# Patient Record
Sex: Female | Born: 1945 | Race: White | Hispanic: No | Marital: Married | Smoking: Never smoker
Health system: Southern US, Community
[De-identification: ages and names within clinical notes are randomized; demographics above are authoritative.]

## PROBLEM LIST (undated history)

## (undated) DIAGNOSIS — M1990 Unspecified osteoarthritis, unspecified site: Secondary | ICD-10-CM

## (undated) DIAGNOSIS — F32A Depression, unspecified: Secondary | ICD-10-CM

## (undated) DIAGNOSIS — E063 Autoimmune thyroiditis: Secondary | ICD-10-CM

## (undated) DIAGNOSIS — E039 Hypothyroidism, unspecified: Secondary | ICD-10-CM

## (undated) DIAGNOSIS — R202 Paresthesia of skin: Secondary | ICD-10-CM

## (undated) DIAGNOSIS — E079 Disorder of thyroid, unspecified: Secondary | ICD-10-CM

## (undated) DIAGNOSIS — G4700 Insomnia, unspecified: Secondary | ICD-10-CM

## (undated) DIAGNOSIS — I4891 Unspecified atrial fibrillation: Secondary | ICD-10-CM

## (undated) DIAGNOSIS — K146 Glossodynia: Secondary | ICD-10-CM

## (undated) DIAGNOSIS — F329 Major depressive disorder, single episode, unspecified: Secondary | ICD-10-CM

## (undated) DIAGNOSIS — F419 Anxiety disorder, unspecified: Secondary | ICD-10-CM

## (undated) HISTORY — DX: Disorder of thyroid, unspecified: E07.9

## (undated) HISTORY — PX: COSMETIC SURGERY: SHX468

## (undated) HISTORY — DX: Hypothyroidism, unspecified: E03.9

## (undated) HISTORY — PX: OTHER SURGICAL HISTORY: SHX169

## (undated) HISTORY — DX: Unspecified osteoarthritis, unspecified site: M19.90

## (undated) HISTORY — DX: Glossodynia: K14.6

## (undated) HISTORY — DX: Depression, unspecified: F32.A

## (undated) HISTORY — DX: Unspecified atrial fibrillation: I48.91

## (undated) HISTORY — PX: JOINT REPLACEMENT: SHX530

## (undated) HISTORY — DX: Major depressive disorder, single episode, unspecified: F32.9

## (undated) HISTORY — DX: Insomnia, unspecified: G47.00

## (undated) HISTORY — DX: Anxiety disorder, unspecified: F41.9

## (undated) HISTORY — PX: ABDOMINAL HYSTERECTOMY: SHX81

## (undated) HISTORY — PX: WRIST SURGERY: SHX841

## (undated) HISTORY — DX: Paresthesia of skin: R20.2

## (undated) SURGERY — COLONOSCOPY
Anesthesia: Moderate Sedation

---

## 2006-09-14 HISTORY — PX: BLADDER SUSPENSION: SHX72

## 2006-09-14 HISTORY — PX: ABDOMINAL HYSTERECTOMY: SHX81

## 2009-09-27 ENCOUNTER — Encounter: Payer: Self-pay | Admitting: Internal Medicine

## 2009-11-28 ENCOUNTER — Encounter: Payer: Self-pay | Admitting: Internal Medicine

## 2010-10-02 ENCOUNTER — Encounter: Payer: Self-pay | Admitting: Internal Medicine

## 2010-10-07 ENCOUNTER — Encounter: Payer: Self-pay | Admitting: Internal Medicine

## 2010-10-07 ENCOUNTER — Ambulatory Visit
Admission: RE | Admit: 2010-10-07 | Discharge: 2010-10-07 | Payer: Self-pay | Source: Home / Self Care | Attending: Internal Medicine | Admitting: Internal Medicine

## 2010-10-07 DIAGNOSIS — R0609 Other forms of dyspnea: Secondary | ICD-10-CM | POA: Insufficient documentation

## 2010-10-07 DIAGNOSIS — R0989 Other specified symptoms and signs involving the circulatory and respiratory systems: Secondary | ICD-10-CM

## 2010-10-07 DIAGNOSIS — R002 Palpitations: Secondary | ICD-10-CM | POA: Insufficient documentation

## 2010-10-16 NOTE — Assessment & Plan Note (Signed)
Summary: HEART PALPITATIONS/MT   Visit Type:  np Referring Provider:  [place]eland  CC:  palpitations, SOB, and .  History of Present Illness: mrsClode disease at the request of Dr. Dallas Schimke because of sensation of rapid fullness in her neck with exercise.  She is a 65 year old woman with a negative family history for heart disease who has a one and a half year history of noting that when she does even modest exertion that her heart is beating fast and pounding in her neck. She is aware both of the pounding as well as a sensation of the heart rate is too fast. This was unassociated until just recently with any change in exercise tolerance; she exercises regularly but in sporadic patterns so that 7 months ago while exercising she noted no issues but more recently she had an she characterizes this as mild shortness of breath. It is unassociated with chest discomfort.  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 She has a sense that her heart accelerates following A. initial slowing. The symptoms have been stable.  She has a history of mild orthostatic intolerance.  She denies peripheral edema nocturnal dyspnea orthopnea. SHe has had some problems with her left foot related to a recent toe fracture  Current Medications (verified): 1)  Synthroid 112 Mcg Tabs (Levothyroxine Sodium) .... Once Daily 2)  Ambien 10 Mg Tabs (Zolpidem Tartrate) .... Once Daily 3)  Omega-3 Fish Oil 1000 Mg Caps (Omega-3 Fatty Acids) .... 2 Daily 4)  Vitamin D3 1000 Unit Tabs (Cholecalciferol) .Marland Kitchen.. 1-2 Daily 5)  Aspirin 81 Mg Tbec (Aspirin) .... Take One Tablet By Mouth Daily 6)  Magnesium 200 Mg Tabs (Magnesium) .... 2 Daily 7)  Multivitamins  Tabs (Multiple Vitamin) .... Once Daily  Allergies (verified): 1)  ! Vancomycin Hcl (Vancomycin Hcl) 2)  ! Clindamycin (Clindamycin Hcl)  Past History:  Past Surgical History: Last updated: 2010-10-31 Hysterectomy Prolapsed Bladder  Family  History: Last updated: 10-31-10 Father: Deceased Mother: Deceased  Social History: Last updated: 10/31/10 Married with children Tobacco Use - No.  Alcohol Use - no Drug Use - no Regular Exercise - yes  Does Not Work  Risk Factors: Exercise: yes (2010-10-31)  Risk Factors: Smoking Status: never (10-31-2010)  Past Medical History: Anxiety Depression Hyperlipidemia Thyroid problems-Hashimoto's with a workup in the past including negative metanephrines  Review of Systems       full review of systems was negative apart from a history of present illness and past medical history.   Vital Signs:  Patient profile:   65 year old female Height:      65 inches Weight:      113.50 pounds BMI:     18.96 Pulse rate:   77 / minute BP sitting:   114 / 65  (left arm) Cuff size:   regular  Vitals Entered By: Caralee Ates CMA (2010-10-31 10:12 AM)  Physical Exam  General:  Well developed, well nourished older Caucasian female appearing her stated age, in no acute distress. Head:  normal HEENT Neck:  supple without JVD or thyromegaly Chest Wall:  without kyphosis Lungs:  clear to auscultation Heart:  regular rate and rhythm without murmurs or gallops Abdomen:  soft with active bowel sounds midline pulsation or hepatomegaly Msk:  no musculoskeletal defects Pulses:  intact distal pulses Extremities:  without clubbing cyanosis or edema Neurologic:  alert and oriented and grossly normal motor and sensory function Skin:  warm and dry Cervical Nodes:  without adenopathy Psych:  engaging affect   EKG  Procedure date:  10/02/2010  Findings:      sinus rhythm at 64 Intervals 0.16/0.09/0.40 R. prime in lead V2 Otherwise normal  Impression & Recommendations:  Problem # 1:  PALPITATIONS (ICD-785.1) her palpitations associated with exercise are suggestive of a inappropriately rapid rate. The pounding could E. to be related to venous distention related to ventricular  ectopy for carotid activity craft is related to increased rate. Trying to clarify this would be the most easily done I think by observing her exercise and her heartrate. We will plan to undertake a stress test.  we will await her TSH but I'm sure that this is normal. We also need to find out whether she has had a hemoglobin drawn.   Her updated medication list for this problem includes:    Aspirin 81 Mg Tbec (Aspirin) .Marland Kitchen... Take one tablet by mouth daily  Orders: Stress Echo (Stress Echo)  Problem # 2:  DYSPNEA ON EXERTION (ICD-786.09) because of the evolution of dyspnea on exertion we'll undertake echocardiography looking for structural heart disease.  we'll plan to try and do this as part of the stress test if we can get approval for her otherwise we will do that as 2 separate undertakings Her updated medication list for this problem includes:    Aspirin 81 Mg Tbec (Aspirin) .Marland Kitchen... Take one tablet by mouth daily  Patient Instructions: 1)  Your physician recommends that you continue on your current medications as directed. Please refer to the Current Medication list given to you today. 2)  Your physician has requested that you have a stress echocardiogram. For further information please visit [email or web address]  Please follow instruction sheet as given.  Appended Document: HEART PALPITATIONS/MT oold records aqnd labs reviewed

## 2010-10-29 ENCOUNTER — Telehealth (INDEPENDENT_AMBULATORY_CARE_PROVIDER_SITE_OTHER): Payer: Self-pay | Admitting: *Deleted

## 2010-10-30 ENCOUNTER — Ambulatory Visit (HOSPITAL_COMMUNITY): Payer: Medicare Other | Attending: Internal Medicine

## 2010-10-30 ENCOUNTER — Encounter: Payer: Self-pay | Admitting: Internal Medicine

## 2010-10-30 ENCOUNTER — Ambulatory Visit (HOSPITAL_COMMUNITY): Payer: Medicare Other

## 2010-10-30 DIAGNOSIS — R0602 Shortness of breath: Secondary | ICD-10-CM

## 2010-10-30 DIAGNOSIS — R0989 Other specified symptoms and signs involving the circulatory and respiratory systems: Secondary | ICD-10-CM

## 2010-10-30 DIAGNOSIS — R0609 Other forms of dyspnea: Secondary | ICD-10-CM | POA: Insufficient documentation

## 2010-10-30 DIAGNOSIS — R002 Palpitations: Secondary | ICD-10-CM | POA: Insufficient documentation

## 2010-10-30 DIAGNOSIS — R5381 Other malaise: Secondary | ICD-10-CM | POA: Insufficient documentation

## 2010-10-30 DIAGNOSIS — R5383 Other fatigue: Secondary | ICD-10-CM | POA: Insufficient documentation

## 2010-10-30 DIAGNOSIS — R42 Dizziness and giddiness: Secondary | ICD-10-CM | POA: Insufficient documentation

## 2010-11-05 NOTE — Letter (Signed)
Summary: [facility]  [facility]   Imported By: Marylou Mccoy 10/29/2010 16:31:23  _____________________________________________________________________  External Attachment:    Type:   Image     Comment:   External Document

## 2010-11-05 NOTE — Progress Notes (Signed)
Summary: Stress echo appt  Phone Note Outgoing Call Call back at [facility] Phone [phone]   Call placed by: Stanton Kidney, EMT-P,  October 29, 2010 2:52 PM Action Taken: Phone Call Completed Summary of Call: S/W patient ref: stress echo appt. Stanton Kidney, EMT-P  October 29, 2010 2:52 PM

## 2010-11-07 ENCOUNTER — Other Ambulatory Visit: Payer: Self-pay | Admitting: Family Medicine

## 2010-11-07 DIAGNOSIS — R52 Pain, unspecified: Secondary | ICD-10-CM

## 2010-11-10 ENCOUNTER — Ambulatory Visit
Admission: RE | Admit: 2010-11-10 | Discharge: 2010-11-10 | Disposition: A | Discharge status: Home / Self Care | Payer: Medicare Other | Source: Ambulatory Visit | Attending: Family Medicine | Admitting: Family Medicine

## 2010-11-10 DIAGNOSIS — R52 Pain, unspecified: Secondary | ICD-10-CM

## 2011-12-29 ENCOUNTER — Ambulatory Visit (INDEPENDENT_AMBULATORY_CARE_PROVIDER_SITE_OTHER): Payer: Medicare Other | Admitting: Family Medicine

## 2011-12-29 VITALS — BP 106/63 | HR 78 | Temp 98.6°F | Resp 18 | Ht 65.0 in | Wt 113.6 lb

## 2011-12-29 DIAGNOSIS — J40 Bronchitis, not specified as acute or chronic: Secondary | ICD-10-CM

## 2011-12-29 MED ORDER — CEFDINIR 300 MG PO CAPS: 300.0000 mg | ORAL_CAPSULE | Freq: Two times a day (BID) | ORAL | Status: AC

## 2011-12-29 NOTE — Progress Notes (Signed)
  Patient Name: Mackenzie [name] Date of Birth: [birth year] Medical Record Number: [id number] Gender: female Date of Encounter: 12/29/2011  History of Present Illness:  Mackenzie [name] is a 66 y.o. very pleasant female patient who presents with the following:  Has been ill for about 3 weeks with cough, chest congestion.  At the start of her illness she had chills and more severe symptoms.  She feels that she was getting better but that her improvement has hit a plateau.   Continues to have cough periods and sometimes a productive cough.  Occasional right ear pains, no ST.  No sinus pressure or pain She has used 2 boxes of mucinex but is not better yet.  Patient Active Problem List  Diagnoses  . PALPITATIONS  . DYSPNEA ON EXERTION   No past medical history on file. No past surgical history on file. History  Substance Use Topics  . Smoking status: Never Smoker   . Smokeless tobacco: Not on file  . Alcohol Use: Not on file   No family history on file. Allergies  Allergen Reactions  . Clindamycin   . Vancomycin Hcl     Medication list has been reviewed and updated.  Review of Systems: As per HPI- otherwise negative.   Physical Examination: Filed Vitals:   12/29/11 0756  BP: 106/63  Pulse: 78  Temp: 98.6 F (37 C)  TempSrc: Oral  Resp: 18  Height: 5\' 5"  (1.651 m)  Weight: 113 lb 9.6 oz (51.529 kg)  SpO2: 100%    Body mass index is 18.90 kg/(m^2).  GEN: WDWN, NAD, Non-toxic, A & O x 3 HEENT: Atraumatic, Normocephalic. Neck supple. No masses, No LAD.  TM, oropharynx wnl Ears and Nose: No external deformity. CV: RRR, No M/G/R. No JVD. No thrill. No extra heart sounds. PULM: CTA B, no wheezes, crackles, rhonchi. No retractions. No resp. distress. No accessory muscle use. ABD: S, NT, ND, +BS. No rebound. No HSM. EXTR: No c/c/e NEURO Normal gait.  PSYCH: Normally interactive. Conversant. Not depressed or anxious appearing.  Calm demeanor.    Assessment and Plan: 1.  Bronchitis  cefdinir (OMNICEF) 300 MG capsule   Treat for bronchitis as above- if not getting better in a few days please let me know- Sooner if worse.

## 2012-02-07 ENCOUNTER — Ambulatory Visit: Payer: Medicare Other

## 2012-02-07 ENCOUNTER — Ambulatory Visit (INDEPENDENT_AMBULATORY_CARE_PROVIDER_SITE_OTHER): Payer: Medicare Other | Admitting: Family Medicine

## 2012-02-07 VITALS — BP 113/61 | HR 71 | Temp 98.1°F | Resp 16

## 2012-02-07 DIAGNOSIS — M47817 Spondylosis without myelopathy or radiculopathy, lumbosacral region: Secondary | ICD-10-CM

## 2012-02-07 DIAGNOSIS — M25559 Pain in unspecified hip: Secondary | ICD-10-CM

## 2012-02-07 DIAGNOSIS — M25561 Pain in right knee: Secondary | ICD-10-CM

## 2012-02-07 DIAGNOSIS — M47816 Spondylosis without myelopathy or radiculopathy, lumbar region: Secondary | ICD-10-CM

## 2012-02-07 DIAGNOSIS — M25569 Pain in unspecified knee: Secondary | ICD-10-CM

## 2012-02-07 MED ORDER — KETOROLAC TROMETHAMINE 60 MG/2ML IM SOLN
60.0000 mg | Freq: Once | INTRAMUSCULAR | Status: AC
  Administered 2012-02-07: 60 mg via INTRAMUSCULAR

## 2012-02-07 MED ORDER — OXYCODONE-ACETAMINOPHEN 5-325 MG PO TABS: 1.0000 | ORAL_TABLET | ORAL | Status: AC | PRN

## 2012-02-07 MED ORDER — OXAPROZIN 600 MG PO TABS: ORAL_TABLET | ORAL | Status: DC

## 2012-02-07 NOTE — Progress Notes (Signed)
Subjective: Patient tripped over a chair and injured her right knee this afternoon. It swelled up immediately on the medial aspect. She is able to walk around fine initially, but now the pain is constantly worse. She can't walk on it. The pain goes up and down her leg. She does have a history of some back problems and sciatica.  Objective: Slight is in moderate to severe pain. Is on she doesn't move at all it doesn't seem to hurt a lot but [place] if she moves at all he starts to cough hurts intensely. No tenderness of the sacrum and low back. The thigh seems okay. She is very tender along the joint line of the right knee. Mild swelling medially. Some tenderness in the popliteal fossa also. The low leg seems fine. Pedal pulses normal. Is able to move her toes okay.  Assessment: Right knee pain and contusion Right leg pain History sciatica  Plan: X-ray right knee [facility] reading (PRIMARY) by  Dr. Alwyn Ren No fracture noted.  DJD low lumbar spine.Marland Kitchen

## 2012-02-07 NOTE — Patient Instructions (Addendum)
Wear knee immobilizer Ice 15-30 min every few house If no relief of pain go to ER Return if not better 2-3 days.

## 2012-02-25 ENCOUNTER — Encounter: Payer: Self-pay | Admitting: Family Medicine

## 2012-02-25 ENCOUNTER — Ambulatory Visit (INDEPENDENT_AMBULATORY_CARE_PROVIDER_SITE_OTHER): Payer: Medicare Other | Admitting: Family Medicine

## 2012-02-25 VITALS — BP 112/59 | HR 75 | Temp 98.6°F | Resp 16 | Ht 65.0 in | Wt 114.8 lb

## 2012-02-25 DIAGNOSIS — F329 Major depressive disorder, single episode, unspecified: Secondary | ICD-10-CM

## 2012-02-25 DIAGNOSIS — E7800 Pure hypercholesterolemia, unspecified: Secondary | ICD-10-CM

## 2012-02-25 DIAGNOSIS — F419 Anxiety disorder, unspecified: Secondary | ICD-10-CM

## 2012-02-25 DIAGNOSIS — Z124 Encounter for screening for malignant neoplasm of cervix: Secondary | ICD-10-CM

## 2012-02-25 DIAGNOSIS — Z01419 Encounter for gynecological examination (general) (routine) without abnormal findings: Secondary | ICD-10-CM

## 2012-02-25 DIAGNOSIS — F341 Dysthymic disorder: Secondary | ICD-10-CM

## 2012-02-25 DIAGNOSIS — Z1231 Encounter for screening mammogram for malignant neoplasm of breast: Secondary | ICD-10-CM

## 2012-02-25 DIAGNOSIS — G4700 Insomnia, unspecified: Secondary | ICD-10-CM

## 2012-02-25 DIAGNOSIS — M898X9 Other specified disorders of bone, unspecified site: Secondary | ICD-10-CM

## 2012-02-25 DIAGNOSIS — Z0000 Encounter for general adult medical examination without abnormal findings: Secondary | ICD-10-CM

## 2012-02-25 DIAGNOSIS — M899 Disorder of bone, unspecified: Secondary | ICD-10-CM

## 2012-02-25 LAB — BASIC METABOLIC PANEL
BUN: 22 mg/dL (ref 6–23)
CO2: 27 mEq/L (ref 19–32)
Chloride: 103 mEq/L (ref 96–112)
Creat: 0.76 mg/dL (ref 0.50–1.10)

## 2012-02-25 LAB — POCT URINALYSIS DIPSTICK
Glucose, UA: NEGATIVE
Nitrite, UA: NEGATIVE
Protein, UA: NEGATIVE
Urobilinogen, UA: 0.2

## 2012-02-25 LAB — LIPID PANEL: LDL Cholesterol: 120 mg/dL — ABNORMAL HIGH (ref 0–99)

## 2012-02-25 LAB — IFOBT (OCCULT BLOOD): IFOBT: NEGATIVE

## 2012-02-25 MED ORDER — FLUOXETINE HCL 20 MG PO CAPS: 20.0000 mg | ORAL_CAPSULE | Freq: Every day | ORAL | Status: DC

## 2012-02-25 MED ORDER — ZOLPIDEM TARTRATE 10 MG PO TABS: 5.0000 mg | ORAL_TABLET | Freq: Every evening | ORAL | Status: DC | PRN

## 2012-02-25 NOTE — Progress Notes (Deleted)
  Subjective:    Patient ID: Mackenzie [name], female    DOB: 08-Aug-1946, 66 y.o.   MRN: [id number]  HPI    Review of Systems  Constitutional: Positive for fatigue.  HENT: Positive for postnasal drip and tinnitus.   Respiratory: Negative.   Cardiovascular: Negative.   Gastrointestinal: Negative.   Genitourinary: Negative.   Musculoskeletal: Positive for back pain and joint swelling.  Skin: Negative.   Neurological: Negative.   Hematological: Negative.   Psychiatric/Behavioral: Positive for disturbed wake/sleep cycle. The patient is nervous/anxious.        Objective:   Physical Exam        Assessment & Plan:

## 2012-02-25 NOTE — Patient Instructions (Signed)

## 2012-02-26 LAB — VITAMIN D 25 HYDROXY (VIT D DEFICIENCY, FRACTURES): Vit D, 25-Hydroxy: 40 ng/mL (ref 30–89)

## 2012-02-28 NOTE — Progress Notes (Signed)
Quick Note:  Please notify pt that results are normal.   Provide pt with copy of labs. ______ 

## 2012-02-29 ENCOUNTER — Encounter: Payer: Self-pay | Admitting: Family Medicine

## 2012-02-29 NOTE — Progress Notes (Signed)
Quick Note:  Notify pt of Normal results. ______ 

## 2012-03-02 ENCOUNTER — Encounter: Payer: Self-pay | Admitting: Family Medicine

## 2012-03-02 DIAGNOSIS — F419 Anxiety disorder, unspecified: Secondary | ICD-10-CM | POA: Insufficient documentation

## 2012-03-02 DIAGNOSIS — F32A Depression, unspecified: Secondary | ICD-10-CM | POA: Insufficient documentation

## 2012-03-02 DIAGNOSIS — G4700 Insomnia, unspecified: Secondary | ICD-10-CM | POA: Insufficient documentation

## 2012-03-02 DIAGNOSIS — E7800 Pure hypercholesterolemia, unspecified: Secondary | ICD-10-CM | POA: Insufficient documentation

## 2012-03-02 NOTE — Progress Notes (Signed)
Subjective:    Patient ID: Mackenzie [name], female    DOB: 1946/04/12, 66 y.o.   MRN: [id number]  HPI This 66 y.o. Cauc female i shere for [employer] Visit. She has Hypothyroidism   which is monitored by Dr. Talmage Nap (labs done last week); she has chronic anxiety and insomnia treated  with Fluoxetine and generic Ambien. Diagnosed with "pre-osteoporosis" years ago at time of last DEXA.  Has problem with thumb joints-pain and deformity; requests referral to Hand Specialist.      Last MMG: in [place], [place].   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 Exercise- daily for ~30 minutes. No tobacco or alcohol.        Review of Systems  Constitutional: Positive for fatigue. Negative for fever, activity change, appetite change and unexpected weight change.  HENT: Positive for postnasal drip and tinnitus. Negative for hearing loss, congestion, sore throat, rhinorrhea, sneezing, trouble swallowing and sinus pressure.   Eyes: Negative for photophobia, pain, redness and visual disturbance.       Dry eyes  Respiratory: Negative.   Cardiovascular: Negative.   Gastrointestinal: Negative.   Genitourinary: Negative.   Musculoskeletal: Positive for myalgias, back pain, joint swelling and arthralgias. Negative for gait problem.  Skin: Negative.   Neurological: Negative.   Hematological: Negative.   Psychiatric/Behavioral: Positive for disturbed wake/sleep cycle. Negative for confusion, self-injury, dysphoric mood, decreased concentration and agitation. The patient is nervous/anxious.        Objective:   Physical Exam  Nursing note and vitals reviewed. Constitutional: She is oriented to person, place, and time. She appears well-developed and well-nourished. No distress.  HENT:  Head: Normocephalic and atraumatic.  Right Ear: External ear normal.  Left Ear: External ear normal.  Nose: Nose normal.  Mouth/Throat: Oropharynx is  clear and moist.  Eyes: Conjunctivae and EOM are normal. Pupils are equal, round, and reactive to light. No scleral icterus.  Neck: Normal range of motion. Neck supple. No JVD present. No thyromegaly present.  Cardiovascular: Normal rate, regular rhythm, normal heart sounds and intact distal pulses.  Exam reveals no gallop and no friction rub.   No murmur heard. Pulmonary/Chest: Effort normal and breath sounds normal. No respiratory distress. She has no wheezes. Right breast exhibits no inverted nipple, no mass, no nipple discharge, no skin change and no tenderness. Left breast exhibits no inverted nipple, no mass, no nipple discharge, no skin change and no tenderness. Breasts are symmetrical.       Breast tissue minimal; no axillary adenopathy  Abdominal: Soft. Bowel sounds are normal. She exhibits no mass. There is no tenderness. There is no guarding.       No organomegaly  Genitourinary: Rectal exam shows no external hemorrhoid, no internal hemorrhoid, no mass, no tenderness and anal tone normal. Guaiac negative stool. There is no rash, tenderness or lesion on the right labia. There is no rash, tenderness or lesion on the left labia. Right adnexum displays no mass, no tenderness and no fullness. Left adnexum displays no mass, no tenderness and no fullness. There is erythema around the vagina. No tenderness around the vagina. No vaginal discharge found.       Vaginal cuff intact (S/P TAH)  Musculoskeletal: Normal range of motion. She exhibits no edema and no tenderness.  Lymphadenopathy:    She has no cervical adenopathy.       Right: No inguinal adenopathy present.  Left: No inguinal adenopathy present.  Neurological: She is alert and oriented to person, place, and time. She has normal reflexes. No cranial nerve deficit. She exhibits normal muscle tone. Coordination normal.  Skin: Skin is warm and dry. No rash noted. No erythema.  Psychiatric: She has a normal mood and affect. Her behavior  is normal. Judgment and thought content normal.          Assessment & Plan:   1. Routine general medical examination at a health care facility  Basic metabolic panel, POCT urinalysis dipstick, IFOBT POC (occult bld, rslt in office)  2. Encounter for cervical Pap smear with pelvic exam  Pap IG w/ reflex to HPV when ASC-U  3. Insomnia -reviewed strategies for improving sleep hygiene RF: Zolpidem 10 mg  1/2 tab hs prn   4. Bone pain  Vitamin D, 25-hydroxy  Ambulatory referral to Hand Surgery- Dr. Amanda Pea  5. Anxiety and depression  RF: Fluoxetine 20 mg  #90  With 3 RFs   6. Elevated LDL cholesterol level  Lipid panel  7. Other screening mammogram  MM Digital Screening

## 2012-03-09 ENCOUNTER — Encounter: Payer: Self-pay | Admitting: Family Medicine

## 2012-03-11 ENCOUNTER — Encounter: Payer: Self-pay | Admitting: Family Medicine

## 2012-03-14 ENCOUNTER — Ambulatory Visit
Admission: RE | Admit: 2012-03-14 | Discharge: 2012-03-14 | Disposition: A | Discharge status: Home / Self Care | Payer: BLUE CROSS/BLUE SHIELD | Source: Ambulatory Visit | Attending: Family Medicine | Admitting: Family Medicine

## 2012-03-14 DIAGNOSIS — Z1231 Encounter for screening mammogram for malignant neoplasm of breast: Secondary | ICD-10-CM

## 2012-10-06 ENCOUNTER — Other Ambulatory Visit: Payer: Self-pay | Admitting: Family Medicine

## 2012-10-11 ENCOUNTER — Inpatient Hospital Stay (HOSPITAL_COMMUNITY)
Admission: EM | Admit: 2012-10-11 | Discharge: 2012-10-15 | DRG: 394 | Disposition: A | Discharge status: Home / Self Care | Payer: Medicare Other | Attending: Internal Medicine | Admitting: Internal Medicine

## 2012-10-11 DIAGNOSIS — K625 Hemorrhage of anus and rectum: Secondary | ICD-10-CM | POA: Diagnosis present

## 2012-10-11 DIAGNOSIS — K559 Vascular disorder of intestine, unspecified: Principal | ICD-10-CM

## 2012-10-11 DIAGNOSIS — D62 Acute posthemorrhagic anemia: Secondary | ICD-10-CM

## 2012-10-11 DIAGNOSIS — F329 Major depressive disorder, single episode, unspecified: Secondary | ICD-10-CM | POA: Diagnosis present

## 2012-10-11 DIAGNOSIS — R109 Unspecified abdominal pain: Secondary | ICD-10-CM

## 2012-10-11 DIAGNOSIS — F411 Generalized anxiety disorder: Secondary | ICD-10-CM | POA: Diagnosis present

## 2012-10-11 DIAGNOSIS — K5900 Constipation, unspecified: Secondary | ICD-10-CM

## 2012-10-11 DIAGNOSIS — K922 Gastrointestinal hemorrhage, unspecified: Secondary | ICD-10-CM

## 2012-10-11 DIAGNOSIS — Z8719 Personal history of other diseases of the digestive system: Secondary | ICD-10-CM

## 2012-10-11 DIAGNOSIS — F3289 Other specified depressive episodes: Secondary | ICD-10-CM | POA: Diagnosis present

## 2012-10-11 DIAGNOSIS — E876 Hypokalemia: Secondary | ICD-10-CM

## 2012-10-11 DIAGNOSIS — F32A Depression, unspecified: Secondary | ICD-10-CM

## 2012-10-11 DIAGNOSIS — K5790 Diverticulosis of intestine, part unspecified, without perforation or abscess without bleeding: Secondary | ICD-10-CM | POA: Diagnosis present

## 2012-10-11 DIAGNOSIS — E063 Autoimmune thyroiditis: Secondary | ICD-10-CM

## 2012-10-11 DIAGNOSIS — F419 Anxiety disorder, unspecified: Secondary | ICD-10-CM | POA: Diagnosis present

## 2012-10-11 HISTORY — DX: Autoimmune thyroiditis: E06.3

## 2012-10-12 ENCOUNTER — Observation Stay (HOSPITAL_COMMUNITY): Payer: Medicare Other

## 2012-10-12 ENCOUNTER — Encounter (HOSPITAL_COMMUNITY): Payer: Self-pay | Admitting: Emergency Medicine

## 2012-10-12 DIAGNOSIS — F341 Dysthymic disorder: Secondary | ICD-10-CM

## 2012-10-12 DIAGNOSIS — R109 Unspecified abdominal pain: Secondary | ICD-10-CM

## 2012-10-12 DIAGNOSIS — R52 Pain, unspecified: Secondary | ICD-10-CM

## 2012-10-12 DIAGNOSIS — K922 Gastrointestinal hemorrhage, unspecified: Secondary | ICD-10-CM

## 2012-10-12 DIAGNOSIS — K5790 Diverticulosis of intestine, part unspecified, without perforation or abscess without bleeding: Secondary | ICD-10-CM | POA: Diagnosis present

## 2012-10-12 DIAGNOSIS — K625 Hemorrhage of anus and rectum: Secondary | ICD-10-CM | POA: Diagnosis present

## 2012-10-12 DIAGNOSIS — K5900 Constipation, unspecified: Secondary | ICD-10-CM

## 2012-10-12 DIAGNOSIS — E063 Autoimmune thyroiditis: Secondary | ICD-10-CM

## 2012-10-12 HISTORY — DX: Autoimmune thyroiditis: E06.3

## 2012-10-12 LAB — URINALYSIS, ROUTINE W REFLEX MICROSCOPIC
Bilirubin Urine: NEGATIVE
Glucose, UA: NEGATIVE mg/dL
Nitrite: NEGATIVE
Protein, ur: NEGATIVE mg/dL
Specific Gravity, Urine: 1.007 (ref 1.005–1.030)
Urobilinogen, UA: 0.2 mg/dL (ref 0.0–1.0)
pH: 6.5 (ref 5.0–8.0)

## 2012-10-12 LAB — URINE MICROSCOPIC-ADD ON

## 2012-10-12 LAB — ABO/RH: ABO/RH(D): O POS

## 2012-10-12 LAB — CBC WITH DIFFERENTIAL/PLATELET
Basophils Absolute: 0 K/uL (ref 0.0–0.1)
Basophils Relative: 0 % (ref 0–1)
Eosinophils Absolute: 0 K/uL (ref 0.0–0.7)
Eosinophils Relative: 0 % (ref 0–5)
HCT: 42.5 % (ref 36.0–46.0)
Hemoglobin: 14.6 g/dL (ref 12.0–15.0)
Lymphocytes Relative: 11 % — ABNORMAL LOW (ref 12–46)
Lymphs Abs: 0.9 K/uL (ref 0.7–4.0)
MCH: 31 pg (ref 26.0–34.0)
MCHC: 34.4 g/dL (ref 30.0–36.0)
MCV: 90.2 fL (ref 78.0–100.0)
Monocytes Absolute: 0.9 K/uL (ref 0.1–1.0)
Monocytes Relative: 10 % (ref 3–12)
Neutro Abs: 6.9 K/uL (ref 1.7–7.7)
Neutrophils Relative %: 79 % — ABNORMAL HIGH (ref 43–77)
Platelets: 225 K/uL (ref 150–400)
RBC: 4.71 MIL/uL (ref 3.87–5.11)
RDW: 13.4 % (ref 11.5–15.5)
WBC: 8.7 K/uL (ref 4.0–10.5)

## 2012-10-12 LAB — CBC
HCT: 35.8 % — ABNORMAL LOW (ref 36.0–46.0)
Hemoglobin: 12.7 g/dL (ref 12.0–15.0)
MCH: 32 pg (ref 26.0–34.0)
MCH: 30.7 pg (ref 26.0–34.0)
MCHC: 35.5 g/dL (ref 30.0–36.0)
MCHC: 33.7 g/dL (ref 30.0–36.0)
MCV: 90.2 fL (ref 78.0–100.0)
MCV: 91 fL (ref 78.0–100.0)
Platelets: 179 10*3/uL (ref 150–400)
RDW: 13.4 % (ref 11.5–15.5)

## 2012-10-12 LAB — COMPREHENSIVE METABOLIC PANEL WITH GFR
ALT: 8 U/L (ref 0–35)
AST: 21 U/L (ref 0–37)
Albumin: 3.9 g/dL (ref 3.5–5.2)
Alkaline Phosphatase: 89 U/L (ref 39–117)
BUN: 21 mg/dL (ref 6–23)
CO2: 25 meq/L (ref 19–32)
Calcium: 9.3 mg/dL (ref 8.4–10.5)
Chloride: 98 meq/L (ref 96–112)
Creatinine, Ser: 0.72 mg/dL (ref 0.50–1.10)
GFR calc Af Amer: >90 mL/min
GFR calc non Af Amer: 87 mL/min — ABNORMAL LOW
Glucose, Bld: 124 mg/dL — ABNORMAL HIGH (ref 70–99)
Potassium: 3.8 meq/L (ref 3.5–5.1)
Sodium: 134 meq/L — ABNORMAL LOW (ref 135–145)
Total Bilirubin: 0.8 mg/dL (ref 0.3–1.2)
Total Protein: 7.1 g/dL (ref 6.0–8.3)

## 2012-10-12 LAB — APTT: aPTT: 27 s (ref 24–37)

## 2012-10-12 LAB — PROTIME-INR
INR: 1 (ref 0.00–1.49)
Prothrombin Time: 13.1 s (ref 11.6–15.2)

## 2012-10-12 MED ORDER — SODIUM CHLORIDE 0.9 % IV SOLN
INTRAVENOUS | Status: AC
  Administered 2012-10-12: 04:00:00 via INTRAVENOUS

## 2012-10-12 MED ORDER — PANTOPRAZOLE SODIUM 40 MG IV SOLR
40.0000 mg | INTRAVENOUS | Status: AC
  Administered 2012-10-12: 40 mg via INTRAVENOUS
  Filled 2012-10-12: qty 40

## 2012-10-12 MED ORDER — SODIUM CHLORIDE 0.9 % IV BOLUS (SEPSIS)
1000.0000 mL | Freq: Once | INTRAVENOUS | Status: AC
  Administered 2012-10-12: 1000 mL via INTRAVENOUS

## 2012-10-12 MED ORDER — ZOLPIDEM TARTRATE 5 MG PO TABS
5.0000 mg | ORAL_TABLET | Freq: Every evening | ORAL | Status: DC | PRN
  Administered 2012-10-12 – 2012-10-14 (×3): 5 mg via ORAL
  Filled 2012-10-12 (×3): qty 1

## 2012-10-12 MED ORDER — IOHEXOL 300 MG/ML  SOLN
50.0000 mL | Freq: Once | INTRAMUSCULAR | Status: AC | PRN
  Administered 2012-10-12: 50 mL via ORAL

## 2012-10-12 MED ORDER — IOHEXOL 300 MG/ML  SOLN
100.0000 mL | Freq: Once | INTRAMUSCULAR | Status: AC | PRN
  Administered 2012-10-12: 80 mL via INTRAVENOUS

## 2012-10-12 MED ORDER — TRAMADOL HCL 50 MG PO TABS
50.0000 mg | ORAL_TABLET | Freq: Four times a day (QID) | ORAL | Status: DC | PRN
  Administered 2012-10-12 – 2012-10-15 (×2): 50 mg via ORAL
  Filled 2012-10-12 (×3): qty 1

## 2012-10-12 MED ORDER — ONDANSETRON HCL 4 MG/2ML IJ SOLN
4.0000 mg | Freq: Three times a day (TID) | INTRAMUSCULAR | Status: AC | PRN
  Administered 2012-10-12 (×2): 4 mg via INTRAVENOUS
  Filled 2012-10-12: qty 2

## 2012-10-12 MED ORDER — DOCUSATE SODIUM 100 MG PO CAPS
100.0000 mg | ORAL_CAPSULE | Freq: Two times a day (BID) | ORAL | Status: DC
  Filled 2012-10-12 (×2): qty 1

## 2012-10-12 MED ORDER — ONDANSETRON HCL 4 MG/2ML IJ SOLN
4.0000 mg | Freq: Once | INTRAMUSCULAR | Status: AC
  Administered 2012-10-12: 4 mg via INTRAVENOUS
  Filled 2012-10-12: qty 2

## 2012-10-12 MED ORDER — SODIUM CHLORIDE 0.9 % IV SOLN
INTRAVENOUS | Status: DC
  Administered 2012-10-12 – 2012-10-14 (×3): via INTRAVENOUS

## 2012-10-12 MED ORDER — THYROID 120 MG PO TABS
120.0000 mg | ORAL_TABLET | Freq: Every day | ORAL | Status: DC
  Administered 2012-10-12 – 2012-10-15 (×4): 120 mg via ORAL
  Filled 2012-10-12 (×4): qty 1

## 2012-10-12 MED ORDER — LORAZEPAM 2 MG/ML IJ SOLN
0.2500 mg | Freq: Once | INTRAMUSCULAR | Status: AC
  Administered 2012-10-12: 0.25 mg via INTRAVENOUS
  Filled 2012-10-12: qty 1

## 2012-10-12 MED ORDER — SODIUM CHLORIDE 0.9 % IJ SOLN
3.0000 mL | Freq: Two times a day (BID) | INTRAMUSCULAR | Status: DC
  Administered 2012-10-12 – 2012-10-15 (×3): 3 mL via INTRAVENOUS

## 2012-10-12 MED ORDER — ACETAMINOPHEN 325 MG PO TABS: 650.0000 mg | ORAL_TABLET | ORAL | Status: DC | PRN

## 2012-10-12 MED ORDER — OXYCODONE HCL 5 MG PO TABS: 5.0000 mg | ORAL_TABLET | ORAL | Status: DC | PRN

## 2012-10-12 NOTE — H&P (Signed)
Chief Complaint:  brbpr  HPI: 67 yo female overall healthy had wrist surgery about 8 weeks ago and has been on a lot of pain meds causing a lot of constipation issues.  She took a dulcolax earlier tonight which caused a lot of lower abd cramping.  She finally had a bm which started off normal then had diarrhea and blood red in color.  H/o diverticulosis.  On rectal exam per edp gross blood on exam.  On no anticoagulants.  Feels better now.  Review of Systems:  O/w neg except per HPI  Past Medical History: Past Medical History  Diagnosis Date  . Arthritis   . Thyroid disease   . Anxiety   . Osteoporosis     per patient questionable   Past Surgical History  Procedure Date  . Cosmetic surgery   . Abdominal hysterectomy   . Bladder prolapse surgery    . Wrist surgery     Medications: Prior to Admission medications   Medication Sig Start Date End Date Taking? Authorizing Provider  cholecalciferol (VITAMIN D) 1000 UNITS tablet Take 1,000 Units by mouth daily.   Yes Historical Provider, MD  Ginger, Zingiber officinalis, (GINGER PO) Take by mouth.   Yes Historical Provider, MD  omega-3 acid ethyl esters (LOVAZA) 1 G capsule Take 1 g by mouth 2 (two) times daily.   Yes Historical Provider, MD  thyroid (ARMOUR) 120 MG tablet Take 120 mg by mouth daily. Thinks she is taking 120   Yes Historical Provider, MD  zolpidem (AMBIEN) 10 MG tablet TAKE 1/2 A TABLET BY MOUTH ONCE AT BEDTIME AS NEEDED 10/06/12  Yes Maurice March, MD    Allergies:   Allergies  Allergen Reactions  . Clindamycin Hives  . Vancomycin Hcl Other (See Comments)    Not sure which medication gave her whelps but this was administered with the clindamycin     Social History:  reports that she has never smoked. She does not have any smokeless tobacco history on file. She reports that she does not drink alcohol or use illicit drugs.  Family History: Family History  Problem Relation Age of Onset  . COPD Mother     emphysema  . Cancer Father     Physical Exam: Filed Vitals:   10/12/12 0004  BP: 95/60  Pulse: 107  Temp: 98.5 F (36.9 C)  TempSrc: Oral  Resp: 16  SpO2: 100%   General appearance: alert, cooperative and no distress Neck: no JVD and supple, symmetrical, trachea midline Lungs: clear to auscultation bilaterally Heart: regular rate and rhythm, S1, S2 normal, no murmur, click, rub or gallop Abdomen: soft, non-tender; bowel sounds normal; no masses,  no organomegaly Extremities: extremities normal, atraumatic, no cyanosis or edema Pulses: 2+ and symmetric Skin: Skin color, texture, turgor normal. No rashes or lesions Neurologic: Grossly normal    Labs on Admission:   Basename 10/12/12 0100  NA 134*  K 3.8  CL 98  CO2 25  GLUCOSE 124*  BUN 21  CREATININE 0.72  CALCIUM 9.3  MG --  PHOS --    Basename 10/12/12 0100  AST 21  ALT 8  ALKPHOS 89  BILITOT 0.8  PROT 7.1  ALBUMIN 3.9    Basename 10/12/12 0100  WBC 8.7  NEUTROABS 6.9  HGB 14.6  HCT 42.5  MCV 90.2  PLT 225    Radiological Exams on Admission: No results found.  Assessment/Plan 67 yo female with constipation and brbpr with normal hgb  Principal Problem:  *  BRBPR (bright red blood per rectum) Active Problems:  Anxiety and depression  Abdominal pain, acute  Diverticulosis  Constipation  obs pt overnight.  Provide some ivf and repeat hgb later this am to monitor hgb.  Monitor closely for any further bleeding, none in several hours.  Likely due to fissure/hemorrhoids but could be diverticular bleed.  Tele bed.  Full code.  Jaydenn Boccio A 10/12/2012, 2:10 AM

## 2012-10-12 NOTE — Care Management Note (Unsigned)
    Page 1 of 1   10/13/2012     11:10:25 AM   CARE MANAGEMENT NOTE 10/13/2012  Patient:  Mackenzie [name], Mackenzie [name]   Account Number:  [id number]  Date Initiated:  10/12/2012  Documentation initiated by:  Lanier Clam  Subjective/Objective Assessment:   ADMITTED W/BLOODY STOOLS.     Action/Plan:   FROM HOME W/SPOUSE.HAS PCP,PHARMACY.   Anticipated DC Date:  10/13/2012   Anticipated DC Plan:  HOME/SELF CARE      DC Planning Services  CM consult      Choice offered to / List presented to:             Status of service:  In process, will continue to follow Medicare Important Message given?   (If response is "NO", the following Medicare IM given date fields will be blank) Date Medicare IM given:   Date Additional Medicare IM given:    Discharge Disposition:    Per UR Regulation:  Reviewed for med. necessity/level of care/duration of stay  If discussed at Long Length of Stay Meetings, dates discussed:    Comments:  10/13/12 [facility] RN,BSN [phone] CT ABD-DIFFUSE COLITIS. 10/12/12 Tasmine Hipwell RN,BSN NCM [phone] MONITORING H&H.

## 2012-10-12 NOTE — ED Notes (Signed)
Pt states a couple of months ago she had surgery on her right wrist  Pt states she was taking pain medication that has caused her to become constipated  Pt states last night she took bisacodyl 5mg  for constipation and bloating  Pt states she woke in the middle of the night with severe cramping  Later she had a BM that turned to diarrhea that had an orange color to it, noticed later it appeared to have blood in it  Pt states she continues to have cramping, blood in her stool, and now has a low grade fever

## 2012-10-12 NOTE — ED Provider Notes (Signed)
History     CSN: [id number]  Arrival date & time 10/11/12  2354   First MD Initiated Contact with Patient 10/12/12 0003      Chief Complaint  Patient presents with  . Abdominal Pain    (Consider location/radiation/quality/duration/timing/severity/associated sxs/prior treatment) HPI Comments: 67 year old female with a history of diverticulosis, hashimoto's thyroid it is and recent right upper extremity wrist surgery by an orthopedic surgeon who presents with a complaint of abdominal pain.  She states that she has been constipated for the last 2 months after taking the pain medication which appears to be hydrocodone per the patient report. She has had infrequent bowel movements and over the last several days has had increased bloating which prompted the use of a laxative approximately 24 hours ago. She developed soft stools which then transformed into an orange-colored loose stool and then became watery diarrhea earlier in the evening. Around 5:00 PM this became bright red in color and is associated with increased cramping and discomfort specifically to the left lower quadrant. She has associated nausea but no vomiting and has no history of anticoagulant use, no history of peptic ulcer disease or duodenal ulcer disease and has confirmed diverticulosis on colonoscopy in the past. The symptoms are persistent, nothing seems to make it better or worse, associated with bloody diarrhea. The patient also complains of a low-grade fever as high as 100 prior to arrival  The history is provided by the patient and medical records.    Past Medical History  Diagnosis Date  . Arthritis   . Thyroid disease   . Anxiety   . Osteoporosis     per patient questionable    Past Surgical History  Procedure Date  . Cosmetic surgery   . Abdominal hysterectomy   . Bladder prolapse surgery    . Wrist surgery     Family History  Problem Relation Age of Onset  . COPD Mother     emphysema  . Cancer Father       History  Substance Use Topics  . Smoking status: Never Smoker   . Smokeless tobacco: Not on file  . Alcohol Use: No    OB History    Grav Para Term Preterm Abortions TAB SAB Ect Mult Living                  Review of Systems  All other systems reviewed and are negative.    Allergies  Clindamycin and Vancomycin hcl  Home Medications   Current Outpatient Rx  Name  Route  Sig  Dispense  Refill  . VITAMIN D 1000 UNITS PO TABS   Oral   Take 1,000 Units by mouth daily.         Marland Kitchen GINGER PO   Oral   Take by mouth.         . OMEGA-3-ACID ETHYL ESTERS 1 G PO CAPS   Oral   Take 1 g by mouth 2 (two) times daily.         . THYROID 120 MG PO TABS   Oral   Take 120 mg by mouth daily. Thinks she is taking 120         . ZOLPIDEM TARTRATE 10 MG PO TABS      TAKE 1/2 A TABLET BY MOUTH ONCE AT BEDTIME AS NEEDED   30 tablet   3     BP 95/60  Pulse 107  Temp 98.5 F (36.9 C) (Oral)  Resp 16  SpO2 100%  Physical Exam  Nursing note and vitals reviewed. Constitutional: She appears well-developed and well-nourished. No distress.  HENT:  Head: Normocephalic and atraumatic.  Mouth/Throat: Oropharynx is clear and moist. No oropharyngeal exudate.  Eyes: Conjunctivae normal and EOM are normal. Pupils are equal, round, and reactive to light. Right eye exhibits no discharge. Left eye exhibits no discharge. No scleral icterus.  Neck: Normal range of motion. Neck supple. No JVD present. No thyromegaly present.  Cardiovascular: Normal rate, regular rhythm, normal heart sounds and intact distal pulses.  Exam reveals no gallop and no friction rub.   No murmur heard. Pulmonary/Chest: Effort normal and breath sounds normal. No respiratory distress. She has no wheezes. She has no rales.  Abdominal: Soft. Bowel sounds are normal. She exhibits no distension and no mass. There is tenderness ( Mild tenderness to palpation without guarding or peritoneal signs to the left lower  quadrant).  Genitourinary:       Chaperone present, rectal exam shows no anal fissures, no internal or external hemorrhoids and no rectal masses. There is bright red blood in the rectum. There is no formed stool in the rectum.  Musculoskeletal: Normal range of motion. She exhibits no edema and no tenderness.  Lymphadenopathy:    She has no cervical adenopathy.  Neurological: She is alert. Coordination normal.  Skin: Skin is warm and dry. No rash noted. No erythema.  Psychiatric: She has a normal mood and affect. Her behavior is normal.    ED Course  Procedures (including critical care time)  Labs Reviewed  CBC WITH DIFFERENTIAL - Abnormal; Notable for the following:    Neutrophils Relative 79 (*)     Lymphocytes Relative 11 (*)     All other components within normal limits  COMPREHENSIVE METABOLIC PANEL - Abnormal; Notable for the following:    Sodium 134 (*)     Glucose, Bld 124 (*)     GFR calc non Af Amer 87 (*)     All other components within normal limits  APTT  PROTIME-INR  TYPE AND SCREEN  ABO/RH   No results found.   1. GI bleed       MDM  Patient appears well however she has hypertension to 95/60 on arrival. She is no longer tachycardic, will place IV, check labs including CBC, coagulation panel, liver function, type and screen and give IV fluids. Her rectal exam is suggestive of a lower GI etiology especially in the absence of any risk factor for upper GI bleeding. She does not use anti-inflammatories, steroids and has no history of peptic ulcer disease.  Gi bleed - stable appearing at this time - will admit for observation.   H/h normal, no other abnormal findings other than gross blood and + hemoccult on my testing.     Vida Roller, MD 10/12/12 [phone]

## 2012-10-12 NOTE — Progress Notes (Signed)
I have seen and assessed patient and agree with Dr Kermit Balo assessment and plan. Patient with some left-sided abdominal cramping. Patient still with some right red blood per rectum. Likely a diverticular bleed versus hemorrhoidal versus fissure. Hemoglobin is stable. Follow H&H. Monitor. If bleeding worsens with no significant improvement will consult with GI for further evaluation and recommendations.

## 2012-10-13 DIAGNOSIS — K922 Gastrointestinal hemorrhage, unspecified: Secondary | ICD-10-CM

## 2012-10-13 DIAGNOSIS — E063 Autoimmune thyroiditis: Secondary | ICD-10-CM

## 2012-10-13 DIAGNOSIS — K559 Vascular disorder of intestine, unspecified: Principal | ICD-10-CM

## 2012-10-13 DIAGNOSIS — D62 Acute posthemorrhagic anemia: Secondary | ICD-10-CM | POA: Diagnosis present

## 2012-10-13 LAB — BASIC METABOLIC PANEL
BUN: 9 mg/dL (ref 6–23)
Chloride: 109 mEq/L (ref 96–112)
Creatinine, Ser: 0.61 mg/dL (ref 0.50–1.10)
GFR calc Af Amer: >90 mL/min (ref >90)
GFR calc non Af Amer: >90 mL/min (ref >90)
Potassium: 3.8 mEq/L (ref 3.5–5.1)

## 2012-10-13 LAB — CBC
MCHC: 33.5 g/dL (ref 30.0–36.0)
Platelets: 160 10*3/uL (ref 150–400)
RDW: 13.6 % (ref 11.5–15.5)
WBC: 8.2 10*3/uL (ref 4.0–10.5)

## 2012-10-13 MED ORDER — METRONIDAZOLE IN NACL 5-0.79 MG/ML-% IV SOLN
500.0000 mg | Freq: Three times a day (TID) | INTRAVENOUS | Status: DC
  Administered 2012-10-13 – 2012-10-15 (×6): 500 mg via INTRAVENOUS
  Filled 2012-10-13 (×7): qty 100

## 2012-10-13 MED ORDER — CIPROFLOXACIN IN D5W 400 MG/200ML IV SOLN
400.0000 mg | Freq: Three times a day (TID) | INTRAVENOUS | Status: DC
  Administered 2012-10-13 – 2012-10-15 (×6): 400 mg via INTRAVENOUS
  Filled 2012-10-13 (×7): qty 200

## 2012-10-13 NOTE — Consult Note (Addendum)
Referring Provider: Dr. Janee Morn Primary Care Physician:  Dow Adolph, MD Primary Gastroenterologist:  Gentry Fitz  Reason for Consultation:  Rectal bleeding  HPI: Mackenzie [name] is a 67 y.o. female who had been having new onset constipation after being on pain meds following a orthopedic surgery when she developed severe abdominal cramping and rectal bleeding after taking a stimulant laxative on Monday night. She had tried other laxatives without benefit prior to the stimulant laxative. She started with formed stools and then developed loose bloody stools with severe cramping that caused her to be doubled over on the floor. Nausea without vomiting occurred. Denies any previous history of hematochezia. Reports personal history of colon polyps on colonoscopy in [place] 5 years ago. CT on presentation showed colitis of the descending colon and mid-distal transverse colon. Feels like pain improved some yesterday but has since increased again today and just had a red bloody formed stool.  Past Medical History  Diagnosis Date  . Arthritis   . Thyroid disease   . Anxiety   . Osteoporosis     per patient questionable  . Hashimoto's thyroiditis 10/12/2012    Past Surgical History  Procedure Date  . Cosmetic surgery   . Abdominal hysterectomy   . Bladder prolapse surgery    . Wrist surgery     Prior to Admission medications   Medication Sig Start Date End Date Taking? Authorizing Provider  cholecalciferol (VITAMIN D) 1000 UNITS tablet Take 1,000 Units by mouth daily.   Yes Historical Provider, MD  Ginger, Zingiber officinalis, (GINGER PO) Take by mouth.   Yes Historical Provider, MD  omega-3 acid ethyl esters (LOVAZA) 1 G capsule Take 1 g by mouth 2 (two) times daily.   Yes Historical Provider, MD  thyroid (ARMOUR) 120 MG tablet Take 120 mg by mouth daily. Thinks she is taking 120   Yes Historical Provider, MD  zolpidem (AMBIEN) 10 MG tablet TAKE 1/2 A TABLET BY MOUTH ONCE AT BEDTIME AS  NEEDED 10/06/12  Yes Maurice March, MD    Scheduled Meds:   . sodium chloride  3 mL Intravenous Q12H  . thyroid  120 mg Oral Daily   Continuous Infusions:   . sodium chloride 100 mL/hr at 10/13/12 0605   PRN Meds:.acetaminophen, oxyCODONE, traMADol, zolpidem  Allergies as of 10/11/2012 - Review Complete 02/25/2012  Allergen Reaction Noted  . Clindamycin  10/07/2010  . Vancomycin hcl  10/07/2010    Family History  Problem Relation Age of Onset  . COPD Mother     emphysema  . Cancer Father     History   Social History  . Marital Status: Married    Spouse Name: N/A    Number of Children: N/A  . Years of Education: N/A   Occupational History  . Not on file.   Social History Main Topics  . Smoking status: Never Smoker   . Smokeless tobacco: Not on file  . Alcohol Use: No  . Drug Use: No  . Sexually Active: Not on file   Other Topics Concern  . Not on file   Social History Narrative  . No narrative on file    Review of Systems: All negative except as stated above in HPI.  Physical Exam: Vital signs: Filed Vitals:   10/13/12 0656  BP: 94/41  Pulse: 102  Temp: 99.8 F (37.7 C)  Resp: 16   Last BM Date: 10/12/12 General:   Alert,  Thin, pleasant and cooperative in NAD HEENT: anicteric Neck: supple, nontender Lungs:  Clear throughout to auscultation.   No wheezes, crackles, or rhonchi. No acute distress. Heart:  Regular rate and rhythm; no murmurs, clicks, rubs,  or gallops. Abdomen: Tender in LLQ, RLQ and suprapubic areas with guarding, soft, mild distention, +BS  Rectal:  Deferred Ext: no edema  GI:  Lab Results:  Basename 10/13/12 0450 10/12/12 1554 10/12/12 0500  WBC 8.2 8.3 6.9  HGB 11.3* 11.6* 12.7  HCT 33.7* 34.4* 35.8*  PLT 160 179 189   BMET  Basename 10/13/12 0450 10/12/12 0100  NA 140 134*  K 3.8 3.8  CL 109 98  CO2 24 25  GLUCOSE 89 124*  BUN 9 21  CREATININE 0.61 0.72  CALCIUM 8.1* 9.3   LFT  Basename 10/12/12  0100  PROT 7.1  ALBUMIN 3.9  AST 21  ALT 8  ALKPHOS 89  BILITOT 0.8  BILIDIR --  IBILI --   PT/INR  Basename 10/12/12 0100  LABPROT 13.1  INR 1.00     Studies/Results: Ct Abdomen Pelvis W Contrast  10/12/2012  *RADIOLOGY REPORT*  Clinical Data: Abdominal pain and cramping.  Rectal bleeding.  CT ABDOMEN AND PELVIS WITH CONTRAST  Technique:  Multidetector CT imaging of the abdomen and pelvis was performed following the standard protocol during bolus administration of intravenous contrast.  Contrast: 80mL OMNIPAQUE IOHEXOL 300 MG/ML  SOLN  Comparison: Right hip radiographs 02/07/2012.  Findings: Minimal linear atelectasis is present at the left lung base.  The lungs are otherwise clear.  Heart size is normal.  No significant pleural or pericardial effusion is present.  The cyst or hemangioma in the left lobe of the liver measures 14 mm.  Punctate calcifications are present within the spleen.  No other focal lesions are evident.  The stomach, duodenum, and pancreas are within normal limits.  The common bile duct and gallbladder are normal. The adrenal glands are within normal limits bilaterally.  An exophytic simple cyst at the upper pole of the right kidney measures 3.4 cm. A more posterior cyst of the left kidney measures 6 mm.  The kidneys are otherwise unremarkable.  Ureters are within normal limits.  Minimal atherosclerotic calcifications are present.  Diffuse wall thickening and inflammatory changes are present within the descending and sigmoid colon beginning just proximal to the splenic flexure.  There is some inflammation of the mid transverse colon is well. The more proximal colon is within normal limits. The appendix is visualized and normal. The small bowel is unremarkable.  Patient is status post hysterectomy.  The ovaries are not discretely seen and may be surgically absent as well.  The urinary bladder is within normal limits.  Leftward curvature of the lumbar spine is noted.   Degenerative changes are present at L4-5 with slight anterolisthesis and at L5- S1 with more normal alignment.  IMPRESSION:  1.  Diffuse colitis in the distal transverse and descending colon. This is nonspecific.  It could be related to inflammatory bowel disease. 2.  Bilateral renal cysts. 3.  Left hepatic cyst versus hemangioma. 4.  Scoliosis.   Original Report Authenticated By: Marin Roberts, M.D.     Impression/Plan: 66yo with acute onset of rectal bleeding and abdominal pain that followed constipation and use of a stimulant laxative. CT concerning for segmental colitis. History consistent with ischemic colitis that may have been initiated by constipation, which could cause ischemia if stool was impacted. Doubt inflammatory bowel disease or infection and history not consistent with malignancy. Needs bowel rest with clears today and ok to  advance tomorrow if pain better. Aggressive IVFs. Cipro/Flagyl. Outpt colonoscopy in February if symptoms improve. Will follow.    LOS: 2 days   Puneet Masoner C.  10/13/2012, 11:52 AM

## 2012-10-13 NOTE — Plan of Care (Signed)
Problem: Phase I Progression Outcomes Goal: Initial discharge plan identified Outcome: Completed/Met Date Met:  10/13/12 Plans to go home

## 2012-10-13 NOTE — Progress Notes (Signed)
[facility] PROGRESS NOTE  Mackenzie [name] WJX:[id number] DOB: [birth year] DOA: 10/11/2012 PCP: Dow Adolph, MD  Assessment/Plan:  #1 bright red blood per can/GI bleed/lower GI bleed Likely secondary to ischemic colitis. Patient with cramping left lower quadrant and upper abdominal pain. Patient with more formed stool however still with some blood in them. CT of the abdomen and pelvis with diffuse colitis in the distal transverse and descending colon. Patient's hemoglobin is currently 11.3 and was 14.6 on admission. Continue to follow H&H. Continue supportive care. Increase IV fluids to normal saline 125 cc per hour. Consult with GI for further evaluation and management.  #2 probable ischemic colitis Patient presented with bright red blood parenchyma after a bout of constipation with abdominal cramping in the left lower quadrant and mid to upper abdomen. CT scan with diffuse colitis in the distal transverse and descending colon. Patient still with some stools bloody however slowly improving. Continue IV fluids. Continue low fiber diet. Continue supportive care. GI consult for further evaluation and management.  #3 acute blood loss anemia Secondary to problem #1. Follow H&H.  #4 history of Hashimoto's thyroiditis Continue thyroid tablet.  #5 anxiety/depression Stable.  #6 prophylaxis SCDs for DVT prophylaxis.  Code Status: Full Family Communication: Updated patient. No family present Disposition Plan: Home when medically stable   Consultants:  GI pending  Procedures:  CT abdomen and pelvis 10/12/12  Antibiotics:  None  HPI/Subjective: Patient states stool is more formed but still some maroon blood. Patient also complaining of abdominal cramping in the left lower quadrant and mid to upper abdomen.  Objective: Filed Vitals:   10/12/12 0628 10/12/12 1402 10/12/12 2130 10/13/12 0656  BP: 107/54 110/48 113/49 94/41  Pulse: 87 79 72 102  Temp: 99.3 F (37.4 C) 99 F  (37.2 C) 98.7 F (37.1 C) 99.8 F (37.7 C)  TempSrc: Oral Oral Oral Oral  Resp: 16 18 16 16   Height:      Weight:      SpO2: 100% 99% 97% 96%    Intake/Output Summary (Last 24 hours) at 10/13/12 1042 Last data filed at 10/13/12 0745  Gross per 24 hour  Intake 2078.33 ml  Output      0 ml  Net 2078.33 ml   Filed Weights   10/12/12 0407  Weight: 50.758 kg (111 lb 14.4 oz)    Exam:   General:  NAD  Cardiovascular: RRR no m/r/g. No LE edema  Respiratory: CTAB  Abdomen: Soft/ ND/TTP in upper abdomen and LLQ/+bs  Data Reviewed: Basic Metabolic Panel:  Lab 10/13/12 2130 10/12/12 0100  NA 140 134*  K 3.8 3.8  CL 109 98  CO2 24 25  GLUCOSE 89 124*  BUN 9 21  CREATININE 0.61 0.72  CALCIUM 8.1* 9.3  MG -- --  PHOS -- --   Liver Function Tests:  Lab 10/12/12 0100  AST 21  ALT 8  ALKPHOS 89  BILITOT 0.8  PROT 7.1  ALBUMIN 3.9   No results found for this basename: LIPASE:5,AMYLASE:5 in the last 168 hours No results found for this basename: AMMONIA:5 in the last 168 hours CBC:  Lab 10/13/12 0450 10/12/12 1554 10/12/12 0500 10/12/12 0100  WBC 8.2 8.3 6.9 8.7  NEUTROABS -- -- -- 6.9  HGB 11.3* 11.6* 12.7 14.6  HCT 33.7* 34.4* 35.8* 42.5  MCV 91.1 91.0 90.2 90.2  PLT 160 179 189 225   Cardiac Enzymes: No results found for this basename: CKTOTAL:5,CKMB:5,CKMBINDEX:5,TROPONINI:5 in the last 168 hours BNP (last 3 results)  No results found for this basename: PROBNP:3 in the last 8760 hours CBG: No results found for this basename: GLUCAP:5 in the last 168 hours  No results found for this or any previous visit (from the past 240 hour(s)).   Studies: Ct Abdomen Pelvis W Contrast  10/12/2012  *RADIOLOGY REPORT*  Clinical Data: Abdominal pain and cramping.  Rectal bleeding.  CT ABDOMEN AND PELVIS WITH CONTRAST  Technique:  Multidetector CT imaging of the abdomen and pelvis was performed following the standard protocol during bolus administration of intravenous  contrast.  Contrast: 80mL OMNIPAQUE IOHEXOL 300 MG/ML  SOLN  Comparison: Right hip radiographs 02/07/2012.  Findings: Minimal linear atelectasis is present at the left lung base.  The lungs are otherwise clear.  Heart size is normal.  No significant pleural or pericardial effusion is present.  The cyst or hemangioma in the left lobe of the liver measures 14 mm.  Punctate calcifications are present within the spleen.  No other focal lesions are evident.  The stomach, duodenum, and pancreas are within normal limits.  The common bile duct and gallbladder are normal. The adrenal glands are within normal limits bilaterally.  An exophytic simple cyst at the upper pole of the right kidney measures 3.4 cm. A more posterior cyst of the left kidney measures 6 mm.  The kidneys are otherwise unremarkable.  Ureters are within normal limits.  Minimal atherosclerotic calcifications are present.  Diffuse wall thickening and inflammatory changes are present within the descending and sigmoid colon beginning just proximal to the splenic flexure.  There is some inflammation of the mid transverse colon is well. The more proximal colon is within normal limits. The appendix is visualized and normal. The small bowel is unremarkable.  Patient is status post hysterectomy.  The ovaries are not discretely seen and may be surgically absent as well.  The urinary bladder is within normal limits.  Leftward curvature of the lumbar spine is noted.  Degenerative changes are present at L4-5 with slight anterolisthesis and at L5- S1 with more normal alignment.  IMPRESSION:  1.  Diffuse colitis in the distal transverse and descending colon. This is nonspecific.  It could be related to inflammatory bowel disease. 2.  Bilateral renal cysts. 3.  Left hepatic cyst versus hemangioma. 4.  Scoliosis.   Original Report Authenticated By: Marin Roberts, M.D.     Scheduled Meds:    . sodium chloride  3 mL Intravenous Q12H  . thyroid  120 mg Oral Daily    Continuous Infusions:    . sodium chloride 100 mL/hr at 10/13/12 0605    Principal Problem:  *GIB (gastrointestinal bleeding) Active Problems:  Anxiety and depression  Abdominal pain, acute  BRBPR (bright red blood per rectum)  Diverticulosis  Constipation  Hashimoto's thyroiditis  Colitis  Ischemic colitis  Acute blood loss anemia    Time spent: > 35 mins    [facility]  [facility] Pager [phone]. If 8PM-8AM, please contact night-coverage at [email or web address], password [facility] 10/13/2012, 10:42 AM  LOS: 2 days

## 2012-10-14 DIAGNOSIS — D62 Acute posthemorrhagic anemia: Secondary | ICD-10-CM

## 2012-10-14 LAB — BASIC METABOLIC PANEL
BUN: 7 mg/dL (ref 6–23)
Chloride: 111 mEq/L (ref 96–112)
GFR calc Af Amer: >90 mL/min (ref >90)
GFR calc non Af Amer: >90 mL/min (ref >90)
Potassium: 3.6 mEq/L (ref 3.5–5.1)
Sodium: 142 mEq/L (ref 135–145)

## 2012-10-14 LAB — CBC
HCT: 31 % — ABNORMAL LOW (ref 36.0–46.0)
Hemoglobin: 10.4 g/dL — ABNORMAL LOW (ref 12.0–15.0)
MCHC: 33.5 g/dL (ref 30.0–36.0)
RDW: 13.7 % (ref 11.5–15.5)
WBC: 5.4 10*3/uL (ref 4.0–10.5)

## 2012-10-14 MED ORDER — ONDANSETRON HCL 4 MG/2ML IJ SOLN
INTRAMUSCULAR | Status: AC
  Administered 2012-10-14: 4 mg
  Filled 2012-10-14: qty 2

## 2012-10-14 MED ORDER — ONDANSETRON HCL 4 MG/2ML IJ SOLN
4.0000 mg | Freq: Four times a day (QID) | INTRAMUSCULAR | Status: DC | PRN
  Administered 2012-10-14: 4 mg via INTRAVENOUS
  Filled 2012-10-14: qty 2

## 2012-10-14 MED ORDER — SODIUM CHLORIDE 0.9 % IV SOLN
INTRAVENOUS | Status: AC
  Administered 2012-10-14: 12:00:00 via INTRAVENOUS

## 2012-10-14 MED ORDER — SODIUM CHLORIDE 0.9 % IV SOLN
INTRAVENOUS | Status: DC
  Administered 2012-10-15: 01:00:00 via INTRAVENOUS

## 2012-10-14 MED ORDER — SODIUM CHLORIDE 0.9 % IV BOLUS (SEPSIS)
1000.0000 mL | Freq: Once | INTRAVENOUS | Status: AC
  Administered 2012-10-14: 1000 mL via INTRAVENOUS

## 2012-10-14 NOTE — Progress Notes (Signed)
[facility] PROGRESS NOTE  Mackenzie [name] ZOX:[id number] DOB: 03/28/1946 DOA: 10/11/2012 PCP: Dow Adolph, MD  Assessment/Plan:  #1 bright red blood per can/GI bleed/lower GI bleed Likely secondary to ischemic colitis. Patient with cramping left lower quadrant and upper abdominal pain improving. Patient with more formed stool however still with some blood in them. CT of the abdomen and pelvis with diffuse colitis in the distal transverse and descending colon. Patient's hemoglobin is currently 10.4 from 11.3 and was 14.6 on admission. Continue to follow H&H. Continue with IV fluids and IV antibiotics. GI following and appreciate input and recommendations. Continue supportive care.   #2 Ischemic colitis Patient presented with bright red blood parenchyma after a bout of constipation with abdominal cramping in the left lower quadrant and mid to upper abdomen. CT scan with diffuse colitis in the distal transverse and descending colon. Patient still with some stools bloody however slowly improving. Continue aggressive IV fluids hydration. Patient was placed on clears yesterday per GI that has now been advanced to a dysphagia 3 diet. Patient's hemoglobin is currently at 10.4. Follow H&H. Continue current antibiotics. Per GI will likely need outpatient colonoscopy in about 3-4 weeks. Continue low fiber diet. Continue supportive care. GI consult for further evaluation and management.  #3 acute blood loss anemia Secondary to problem #1. Follow H&H.  #4 history of Hashimoto's thyroiditis Continue thyroid tablet.  #5 anxiety/depression Stable.  #6 prophylaxis SCDs for DVT prophylaxis.  Code Status: Full Family Communication: Updated patient. No family present Disposition Plan: Home when medically stable   Consultants:  GI Dr. Bosie Clos 10/13/2012  Procedures:  CT abdomen and pelvis 10/12/12  Antibiotics:  IV ciprofloxacin 10/13/2012  IV Flagyl  10/13/2012  HPI/Subjective: Patient states stool is more formed but still some maroon blood. Patient states abdominal cramping is improving.   Objective: Filed Vitals:   10/13/12 1430 10/13/12 2156 10/14/12 0641 10/14/12 1127  BP: 109/46 99/38 98/36  99/41  Pulse: 87 72 66   Temp: 98.8 F (37.1 C) 99.2 F (37.3 C) 98.1 F (36.7 C)   TempSrc: Oral Oral Oral   Resp: 16 16 16    Height:      Weight:      SpO2: 98% 100% 99%     Intake/Output Summary (Last 24 hours) at 10/14/12 1134 Last data filed at 10/14/12 0700  Gross per 24 hour  Intake 4302.92 ml  Output      0 ml  Net 4302.92 ml   Filed Weights   10/12/12 0407  Weight: 50.758 kg (111 lb 14.4 oz)    Exam:   General:  NAD  Cardiovascular: RRR no m/r/g. No LE edema  Respiratory: CTAB  Abdomen: Soft/ ND/TTP in LLQ/+bs  Data Reviewed: Basic Metabolic Panel:  Lab 10/14/12 8119 10/13/12 0450 10/12/12 0100  NA 142 140 134*  K 3.6 3.8 3.8  CL 111 109 98  CO2 25 24 25   GLUCOSE 90 89 124*  BUN 7 9 21   CREATININE 0.63 0.61 0.72  CALCIUM 7.9* 8.1* 9.3  MG -- -- --  PHOS -- -- --   Liver Function Tests:  Lab 10/12/12 0100  AST 21  ALT 8  ALKPHOS 89  BILITOT 0.8  PROT 7.1  ALBUMIN 3.9   No results found for this basename: LIPASE:5,AMYLASE:5 in the last 168 hours No results found for this basename: AMMONIA:5 in the last 168 hours CBC:  Lab 10/14/12 0500 10/13/12 0450 10/12/12 1554 10/12/12 0500 10/12/12 0100  WBC 5.4 8.2 8.3 6.9 8.7  NEUTROABS -- -- -- -- 6.9  HGB 10.4* 11.3* 11.6* 12.7 14.6  HCT 31.0* 33.7* 34.4* 35.8* 42.5  MCV 92.5 91.1 91.0 90.2 90.2  PLT 143* 160 179 189 225   Cardiac Enzymes: No results found for this basename: CKTOTAL:5,CKMB:5,CKMBINDEX:5,TROPONINI:5 in the last 168 hours BNP (last 3 results) No results found for this basename: PROBNP:3 in the last 8760 hours CBG: No results found for this basename: GLUCAP:5 in the last 168 hours  No results found for this or any previous  visit (from the past 240 hour(s)).   Studies: Ct Abdomen Pelvis W Contrast  10/12/2012  *RADIOLOGY REPORT*  Clinical Data: Abdominal pain and cramping.  Rectal bleeding.  CT ABDOMEN AND PELVIS WITH CONTRAST  Technique:  Multidetector CT imaging of the abdomen and pelvis was performed following the standard protocol during bolus administration of intravenous contrast.  Contrast: 80mL OMNIPAQUE IOHEXOL 300 MG/ML  SOLN  Comparison: Right hip radiographs 02/07/2012.  Findings: Minimal linear atelectasis is present at the left lung base.  The lungs are otherwise clear.  Heart size is normal.  No significant pleural or pericardial effusion is present.  The cyst or hemangioma in the left lobe of the liver measures 14 mm.  Punctate calcifications are present within the spleen.  No other focal lesions are evident.  The stomach, duodenum, and pancreas are within normal limits.  The common bile duct and gallbladder are normal. The adrenal glands are within normal limits bilaterally.  An exophytic simple cyst at the upper pole of the right kidney measures 3.4 cm. A more posterior cyst of the left kidney measures 6 mm.  The kidneys are otherwise unremarkable.  Ureters are within normal limits.  Minimal atherosclerotic calcifications are present.  Diffuse wall thickening and inflammatory changes are present within the descending and sigmoid colon beginning just proximal to the splenic flexure.  There is some inflammation of the mid transverse colon is well. The more proximal colon is within normal limits. The appendix is visualized and normal. The small bowel is unremarkable.  Patient is status post hysterectomy.  The ovaries are not discretely seen and may be surgically absent as well.  The urinary bladder is within normal limits.  Leftward curvature of the lumbar spine is noted.  Degenerative changes are present at L4-5 with slight anterolisthesis and at L5- S1 with more normal alignment.  IMPRESSION:  1.  Diffuse colitis  in the distal transverse and descending colon. This is nonspecific.  It could be related to inflammatory bowel disease. 2.  Bilateral renal cysts. 3.  Left hepatic cyst versus hemangioma. 4.  Scoliosis.   Original Report Authenticated By: Marin Roberts, M.D.     Scheduled Meds:    . ciprofloxacin  400 mg Intravenous Q8H  . metronidazole  500 mg Intravenous Q8H  . sodium chloride  3 mL Intravenous Q12H  . thyroid  120 mg Oral Daily   Continuous Infusions:    . sodium chloride    . sodium chloride      Principal Problem:  *Ischemic colitis Active Problems:  Anxiety and depression  Abdominal pain, acute  BRBPR (bright red blood per rectum)  Diverticulosis  Constipation  Hashimoto's thyroiditis  Colitis  GIB (gastrointestinal bleeding)  Acute blood loss anemia    Time spent: > 35 mins    [facility]  [facility] Pager [phone]. If 8PM-8AM, please contact night-coverage at [email or web address], password [facility] 10/14/2012, 11:34 AM  LOS: 3 days

## 2012-10-14 NOTE — Progress Notes (Signed)
Nutrition Education Note  - Received consult for low fiber education. Met with pt and used teach back method to educate pt on low fiber diet for colitis and importance of adding fiber back into diet for diverticulosis as symptoms improve. Low and high fiber diets discussed and handouts provided. RD contact information provided. Pt expressed understanding.   Levon Hedger MS, RD, LDN [phone] Pager [phone] After Hours Pager

## 2012-10-14 NOTE — Progress Notes (Signed)
Patient ID: Mackenzie [name], female   DOB: 1946/06/14, 67 y.o.   MRN: [id number] [facility] Gastroenterology Progress Note  Mackenzie [name] 67 y.o. 05-10-1946   Subjective: Feels that abdominal pain is better than yesterday. Feels like rectal bleeding has decreased in amount compared to yesterday.  Objective: Vital signs: Filed Vitals:   10/14/12 0641  BP: 98/36  Pulse: 66  Temp: 98.1 F (36.7 C)  Resp: 16    Physical Exam: Gen: alert, no acute distress  Abd: less tender in LLQ, suprapubic, and RLQ with guarding in LLQ, soft, nondistended, +BS  Lab Results:  [facility] 10/14/12 0500 10/13/12 0450  NA 142 140  K 3.6 3.8  CL 111 109  CO2 25 24  GLUCOSE 90 89  BUN 7 9  CREATININE 0.63 0.61  CALCIUM 7.9* 8.1*  MG -- --  PHOS -- --    Basename 10/12/12 0100  AST 21  ALT 8  ALKPHOS 89  BILITOT 0.8  PROT 7.1  ALBUMIN 3.9    Basename 10/14/12 0500 10/13/12 0450 10/12/12 0100  WBC 5.4 8.2 --  NEUTROABS -- -- 6.9  HGB 10.4* 11.3* --  HCT 31.0* 33.7* --  MCV 92.5 91.1 --  PLT 143* 160 --      Assessment/Plan: Ischemic colitis - improving on IVFs, Abx. Will advance diet slowly. If stable can d/c tomorrow from GI standpoint. F/U with me in 3-4 weeks and will do outpt colonoscopy. Dr. Ewing Schlein available to see tomorrow.   Mackenzie [name] C. 10/14/2012, 9:09 AM

## 2012-10-15 DIAGNOSIS — E876 Hypokalemia: Secondary | ICD-10-CM

## 2012-10-15 LAB — CBC
HCT: 31.9 % — ABNORMAL LOW (ref 36.0–46.0)
Hemoglobin: 10.8 g/dL — ABNORMAL LOW (ref 12.0–15.0)
RBC: 3.52 MIL/uL — ABNORMAL LOW (ref 3.87–5.11)

## 2012-10-15 LAB — BASIC METABOLIC PANEL
BUN: 5 mg/dL — ABNORMAL LOW (ref 6–23)
CO2: 25 mEq/L (ref 19–32)
Chloride: 108 mEq/L (ref 96–112)
Glucose, Bld: 95 mg/dL (ref 70–99)
Potassium: 3.3 mEq/L — ABNORMAL LOW (ref 3.5–5.1)
Sodium: 141 mEq/L (ref 135–145)

## 2012-10-15 MED ORDER — CIPROFLOXACIN HCL 500 MG PO TABS
500.0000 mg | ORAL_TABLET | Freq: Two times a day (BID) | ORAL | Status: DC
  Filled 2012-10-15 (×3): qty 1

## 2012-10-15 MED ORDER — CIPROFLOXACIN HCL 500 MG PO TABS
500.0000 mg | ORAL_TABLET | Freq: Two times a day (BID) | ORAL | Status: DC
  Filled 2012-10-15 (×2): qty 1

## 2012-10-15 MED ORDER — PROMETHAZINE HCL 12.5 MG PO TABS: 25.0000 mg | ORAL_TABLET | Freq: Four times a day (QID) | ORAL | Status: DC | PRN

## 2012-10-15 MED ORDER — CIPROFLOXACIN HCL 500 MG PO TABS: 500.0000 mg | ORAL_TABLET | Freq: Two times a day (BID) | ORAL | Status: AC

## 2012-10-15 MED ORDER — TRAMADOL HCL 50 MG PO TABS: 50.0000 mg | ORAL_TABLET | Freq: Four times a day (QID) | ORAL | Status: DC | PRN

## 2012-10-15 MED ORDER — POTASSIUM CHLORIDE CRYS ER 20 MEQ PO TBCR
40.0000 meq | EXTENDED_RELEASE_TABLET | Freq: Once | ORAL | Status: AC
  Administered 2012-10-15: 40 meq via ORAL
  Filled 2012-10-15 (×2): qty 2

## 2012-10-15 MED ORDER — PROCHLORPERAZINE EDISYLATE 5 MG/ML IJ SOLN
10.0000 mg | Freq: Four times a day (QID) | INTRAMUSCULAR | Status: DC | PRN
  Administered 2012-10-15: 10 mg via INTRAVENOUS
  Filled 2012-10-15: qty 2

## 2012-10-15 MED ORDER — METRONIDAZOLE 500 MG PO TABS: 500.0000 mg | ORAL_TABLET | Freq: Three times a day (TID) | ORAL | Status: AC

## 2012-10-15 MED ORDER — METRONIDAZOLE 500 MG PO TABS
500.0000 mg | ORAL_TABLET | Freq: Three times a day (TID) | ORAL | Status: DC
  Filled 2012-10-15 (×3): qty 1

## 2012-10-15 NOTE — Progress Notes (Signed)
Mackenzie [name] 10:47 AM  Subjective: Patient is doing much better tolerating her diet and essentially no further bleeding and no new complaints  Objective: Vital signs stable afebrile no acute distress abdomen is soft nontender hemoglobin stable  Assessment: Ischemic colitis  Plan: Okay with me to go home and followup with my partner in one to 2 weeks to set up a colonoscopy and call us sooner when necessary  [facility] E

## 2012-10-15 NOTE — Discharge Summary (Signed)
Physician Discharge Summary  Mackenzie [name] ZOX:[id number] DOB: 25-Nov-1945 DOA: 10/11/2012  PCP: Dow Adolph, MD  Admit date: 10/11/2012 Discharge date: 10/15/2012  Time spent: 65 minutes  Recommendations for Outpatient Follow-up:  1. Followup with PCP one week post discharge. On followup CBC will need to be checked to followup on patient's hemoglobin. A basic metabolic profile will need to be checked to followup on patient's electrolytes and renal function. 2. Patient is to followup with Dr. Bosie Clos, gastroenterology in about 3-4 weeks for further evaluation and outpatient colonoscopy.  Discharge Diagnoses:  Principal Problem:  *Ischemic colitis Active Problems:  Anxiety and depression  Abdominal pain, acute  BRBPR (bright red blood per rectum)  Diverticulosis  Constipation  Hashimoto's thyroiditis  Colitis  GIB (gastrointestinal bleeding)  Acute blood loss anemia  Hypokalemia   Discharge Condition: Stable and improved  Diet recommendation: Regular low fiber diet  Filed Weights   10/12/12 0407  Weight: 50.758 kg (111 lb 14.4 oz)    History of present illness:  67 yo female overall healthy had wrist surgery about 8 weeks ago and has been on a lot of pain meds causing a lot of constipation issues. She took a dulcolax earlier tonight which caused a lot of lower abd cramping. She finally had a bm which started off normal then had diarrhea and blood red in color. H/o diverticulosis. On rectal exam per edp gross blood on exam. On no anticoagulants. Feels better now   Hospital Course:  #1 bright red blood per can/GI bleed/lower GI bleed  Patient was admitted with bright red blood per rectum/lower GI bleed. Which had occurred after an episode of constipation. Patient did have some associated  Lower abdominal cramping. Patient was placed on the telemetry floor and serial CBC's were taken. Patient was monitored patient continued to have bloody bowel movements and a such a CT scan of  the abdomen and pelvis was obtained. CT scan of the abdomen and pelvis did show diffuse colitis in the distal transverse and descending colon. On admission patient's hemoglobin was 14.6 and trended down and plateaued at about 10.8. Patient was empirically started on IV ciprofloxacin as well as IV Flagyl and monitored. Patient was placed on IV fluids and aggressively hydrated. GI consultation was obtained and patient was seen in consultation by Dr. Bosie Clos on 10/13/2012. It was felt like patient's symptoms were consistent with ischemic colitis. Patient was placed on bowel rest with some clear liquids and monitored. Patient improved symptomatically and her diet was advanced which she tolerated. Patient's bloody bowel movements improved such that by day of discharge patient had very minimal blood in his stools. Patient will be discharged home on 5 days of oral ciprofloxacin and Flagyl. Patient will followup with Dr. Bosie Clos gastroenterology in about 3-4 weeks for outpatient colonoscopy. Patient will be discharged home in stable and improved condition. Hemoglobin on day of discharge was 10.8.   #2 Ischemic colitis  Patient presented with bright red blood per rectum after a bout of constipation with abdominal cramping in the left lower quadrant and mid to upper abdomen. CT scan done showed diffuse colitis in the distal transverse and descending colon. Patient did have bloody bowel movements during the hospitalization which improved on a daily basis. Patient was hydrated aggressively with IV fluids and placed on IV ciprofloxacin and Flagyl. Patient's hemoglobin on admission was 14.6. Patient's hemoglobin trended down and stabilized in the mid tens. A GI consultation was obtained and patient was seen in consultation by Dr. Bosie Clos. It was  felt patient likely did have an ischemic colitis. Patient was placed on bowel rest. Aggressively hydrated with IV fluids. On follow. Patient improved clinically and symptomatically  and bloody bowel movements decreased. Patient's diet was advanced and she tolerated a regular diet. Patient's abdominal pain improved and had resolved by day of discharge. On day of discharge patient's hemoglobin was 10.8. Patient will followup with GI as outpatient in 3-4 weeks for colonoscopy and further evaluation. Patient be discharged in stable and improved condition. #3 acute blood loss anemia  Secondary to problem #1 and 2. Hemoglobin on day of discharge was 10.8 and had stabilized.   The rest of patient's chronic medical issues remained stable throughout the hospitalization the patient be discharged in stable and improved condition.     Procedures:  CT abdomen and pelvis 10/12/2012  Consultations:  Gastroenterology: Dr. Bosie Clos 10/13/2012  Discharge Exam: Filed Vitals:   10/14/12 1127 10/14/12 1255 10/14/12 2105 10/15/12 0433  BP: 99/41 101/40 122/49 117/49  Pulse:  75 70 77  Temp:  98.1 F (36.7 C) 98.2 F (36.8 C) 98.4 F (36.9 C)  TempSrc:  Oral Oral Oral  Resp:  18 20 15   Height:      Weight:      SpO2:  99% 99% 100%    General: NAD Cardiovascular: RRR. No lower extremity edema. Respiratory: CTAB Abdomen: Soft, nontender, nondistended, positive bowel sounds.  Discharge Instructions  Discharge Orders    Future Appointments: Provider: Department: Dept Phone: Center:   03/02/2013 9:15 AM Maurice March, MD [facility] [phone]     Future Orders Please Complete By Expires   Diet general      Comments:   Low fiber diet   Increase activity slowly      Discharge instructions      Comments:   Follow up with [facility], MD in 1 week Follow up with Dr Bosie Clos in 2-3 weeks       Medication List     As of 10/15/2012 10:09 AM    TAKE these medications         cholecalciferol 1000 UNITS tablet   Commonly known as: VITAMIN D   Take 1,000 Units by mouth daily.      ciprofloxacin 500 MG tablet   Commonly known as: CIPRO    Take 1 tablet (500 mg total) by mouth 2 (two) times daily. Take for 5 days then stop.      GINGER PO   Take by mouth.      metroNIDAZOLE 500 MG tablet   Commonly known as: FLAGYL   Take 1 tablet (500 mg total) by mouth every 8 (eight) hours. Take for 5 days then stop.      omega-3 acid ethyl esters 1 G capsule   Commonly known as: LOVAZA   Take 1 g by mouth 2 (two) times daily.      promethazine 12.5 MG tablet   Commonly known as: PHENERGAN   Take 2 tablets (25 mg total) by mouth every 6 (six) hours as needed for nausea.      thyroid 120 MG tablet   Commonly known as: ARMOUR   Take 120 mg by mouth daily. Thinks she is taking 120      traMADol 50 MG tablet   Commonly known as: ULTRAM   Take 1 tablet (50 mg total) by mouth every 6 (six) hours as needed for pain.      zolpidem 10 MG tablet   Commonly known  as: AMBIEN   TAKE 1/2 A TABLET BY MOUTH ONCE AT BEDTIME AS NEEDED           Follow-up Information    Follow up with [facility], MD. Schedule an appointment as soon as possible for a visit in 1 week.   Contact information:   [address] [place] [address] [phone]       Follow up with SCHOOLER,VINCENT C., MD. Schedule an appointment as soon as possible for a visit in 3 weeks. (f/u in 3-4 weeks for colonoscopy)    Contact information:   [address], [address] [place] [address] [phone]           The results of significant diagnostics from this hospitalization (including imaging, microbiology, ancillary and laboratory) are listed below for reference.    Significant Diagnostic Studies: Ct Abdomen Pelvis W Contrast  10/12/2012  *RADIOLOGY REPORT*  Clinical Data: Abdominal pain and cramping.  Rectal bleeding.  CT ABDOMEN AND PELVIS WITH CONTRAST  Technique:  Multidetector CT imaging of the abdomen and pelvis was performed following the standard protocol during bolus administration of intravenous  contrast.  Contrast: 80mL OMNIPAQUE IOHEXOL 300 MG/ML  SOLN  Comparison: Right hip radiographs 02/07/2012.  Findings: Minimal linear atelectasis is present at the left lung base.  The lungs are otherwise clear.  Heart size is normal.  No significant pleural or pericardial effusion is present.  The cyst or hemangioma in the left lobe of the liver measures 14 mm.  Punctate calcifications are present within the spleen.  No other focal lesions are evident.  The stomach, duodenum, and pancreas are within normal limits.  The common bile duct and gallbladder are normal. The adrenal glands are within normal limits bilaterally.  An exophytic simple cyst at the upper pole of the right kidney measures 3.4 cm. A more posterior cyst of the left kidney measures 6 mm.  The kidneys are otherwise unremarkable.  Ureters are within normal limits.  Minimal atherosclerotic calcifications are present.  Diffuse wall thickening and inflammatory changes are present within the descending and sigmoid colon beginning just proximal to the splenic flexure.  There is some inflammation of the mid transverse colon is well. The more proximal colon is within normal limits. The appendix is visualized and normal. The small bowel is unremarkable.  Patient is status post hysterectomy.  The ovaries are not discretely seen and may be surgically absent as well.  The urinary bladder is within normal limits.  Leftward curvature of the lumbar spine is noted.  Degenerative changes are present at L4-5 with slight anterolisthesis and at L5- S1 with more normal alignment.  IMPRESSION:  1.  Diffuse colitis in the distal transverse and descending colon. This is nonspecific.  It could be related to inflammatory bowel disease. 2.  Bilateral renal cysts. 3.  Left hepatic cyst versus hemangioma. 4.  Scoliosis.   Original Report Authenticated By: Marin Roberts, M.D.     Microbiology: No results found for this or any previous visit (from the past 240 hour(s)).    Labs: Basic Metabolic Panel:  Lab 10/15/12 5784 10/14/12 0500 10/13/12 0450 10/12/12 0100  NA 141 142 140 134*  K 3.3* 3.6 3.8 3.8  CL 108 111 109 98  CO2 25 25 24 25   GLUCOSE 95 90 89 124*  BUN 5* 7 9 21   CREATININE 0.60 0.63 0.61 0.72  CALCIUM 8.6 7.9* 8.1* 9.3  MG -- -- -- --  PHOS -- -- -- --  Liver Function Tests:  Lab 10/12/12 0100  AST 21  ALT 8  ALKPHOS 89  BILITOT 0.8  PROT 7.1  ALBUMIN 3.9   No results found for this basename: LIPASE:5,AMYLASE:5 in the last 168 hours No results found for this basename: AMMONIA:5 in the last 168 hours CBC:  Lab 10/15/12 0437 10/14/12 0500 10/13/12 0450 10/12/12 1554 10/12/12 0500 10/12/12 0100  WBC 4.4 5.4 8.2 8.3 6.9 --  NEUTROABS -- -- -- -- -- 6.9  HGB 10.8* 10.4* 11.3* 11.6* 12.7 --  HCT 31.9* 31.0* 33.7* 34.4* 35.8* --  MCV 90.6 92.5 91.1 91.0 90.2 --  PLT 181 143* 160 179 189 --   Cardiac Enzymes: No results found for this basename: CKTOTAL:5,CKMB:5,CKMBINDEX:5,TROPONINI:5 in the last 168 hours BNP: BNP (last 3 results) No results found for this basename: PROBNP:3 in the last 8760 hours CBG: No results found for this basename: GLUCAP:5 in the last 168 hours     Signed:  THOMPSON,DANIEL  [facility] 10/15/2012, 10:09 AM

## 2012-10-20 ENCOUNTER — Encounter: Payer: Medicare Other | Admitting: Family Medicine

## 2012-11-22 ENCOUNTER — Emergency Department (HOSPITAL_COMMUNITY): Payer: Medicare Other

## 2012-11-22 ENCOUNTER — Inpatient Hospital Stay (HOSPITAL_COMMUNITY)
Admission: EM | Admit: 2012-11-22 | Discharge: 2012-11-24 | DRG: 309 | Disposition: A | Discharge status: Home / Self Care | Payer: Medicare Other | Attending: Internal Medicine | Admitting: Internal Medicine

## 2012-11-22 ENCOUNTER — Encounter (HOSPITAL_COMMUNITY): Payer: Self-pay | Admitting: Emergency Medicine

## 2012-11-22 DIAGNOSIS — F3289 Other specified depressive episodes: Secondary | ICD-10-CM | POA: Diagnosis present

## 2012-11-22 DIAGNOSIS — K648 Other hemorrhoids: Secondary | ICD-10-CM | POA: Diagnosis present

## 2012-11-22 DIAGNOSIS — Z860100 Personal history of colon polyps, unspecified: Secondary | ICD-10-CM

## 2012-11-22 DIAGNOSIS — M810 Age-related osteoporosis without current pathological fracture: Secondary | ICD-10-CM | POA: Diagnosis present

## 2012-11-22 DIAGNOSIS — R002 Palpitations: Secondary | ICD-10-CM | POA: Diagnosis present

## 2012-11-22 DIAGNOSIS — Z79899 Other long term (current) drug therapy: Secondary | ICD-10-CM

## 2012-11-22 DIAGNOSIS — E063 Autoimmune thyroiditis: Secondary | ICD-10-CM | POA: Diagnosis present

## 2012-11-22 DIAGNOSIS — K5730 Diverticulosis of large intestine without perforation or abscess without bleeding: Secondary | ICD-10-CM | POA: Diagnosis present

## 2012-11-22 DIAGNOSIS — I4891 Unspecified atrial fibrillation: Principal | ICD-10-CM | POA: Diagnosis present

## 2012-11-22 DIAGNOSIS — K559 Vascular disorder of intestine, unspecified: Secondary | ICD-10-CM | POA: Diagnosis present

## 2012-11-22 DIAGNOSIS — F411 Generalized anxiety disorder: Secondary | ICD-10-CM | POA: Diagnosis present

## 2012-11-22 LAB — COMPREHENSIVE METABOLIC PANEL
ALT: 14 U/L (ref 0–35)
AST: 26 U/L (ref 0–37)
Alkaline Phosphatase: 68 U/L (ref 39–117)
CO2: 21 mEq/L (ref 19–32)
Calcium: 10.1 mg/dL (ref 8.4–10.5)
Chloride: 99 mEq/L (ref 96–112)
GFR calc Af Amer: >90 mL/min (ref >90)
GFR calc non Af Amer: 86 mL/min — ABNORMAL LOW (ref >90)
Glucose, Bld: 62 mg/dL — ABNORMAL LOW (ref 70–99)
Sodium: 139 mEq/L (ref 135–145)
Total Bilirubin: 0.7 mg/dL (ref 0.3–1.2)

## 2012-11-22 LAB — TROPONIN I
Troponin I: <0.3 ng/mL (ref <0.30)
Troponin I: <0.3 ng/mL (ref <0.30)

## 2012-11-22 LAB — PROTIME-INR: Prothrombin Time: 12.8 seconds (ref 11.6–15.2)

## 2012-11-22 LAB — CBC WITH DIFFERENTIAL/PLATELET
Basophils Absolute: 0 10*3/uL (ref 0.0–0.1)
Eosinophils Relative: 0 % (ref 0–5)
HCT: 44.1 % (ref 36.0–46.0)
Lymphocytes Relative: 15 % (ref 12–46)
Lymphs Abs: 0.7 10*3/uL (ref 0.7–4.0)
MCV: 92.3 fL (ref 78.0–100.0)
Monocytes Absolute: 0.2 10*3/uL (ref 0.1–1.0)
Neutro Abs: 3.9 10*3/uL (ref 1.7–7.7)
Platelets: 239 10*3/uL (ref 150–400)
RBC: 4.78 MIL/uL (ref 3.87–5.11)
WBC: 4.8 10*3/uL (ref 4.0–10.5)

## 2012-11-22 MED ORDER — SODIUM CHLORIDE 0.9 % IJ SOLN: 3.0000 mL | Freq: Two times a day (BID) | INTRAMUSCULAR | Status: DC

## 2012-11-22 MED ORDER — ZOLPIDEM TARTRATE 5 MG PO TABS
5.0000 mg | ORAL_TABLET | Freq: Every evening | ORAL | Status: DC | PRN
  Administered 2012-11-22 – 2012-11-23 (×2): 5 mg via ORAL
  Filled 2012-11-22 (×2): qty 1

## 2012-11-22 MED ORDER — SODIUM CHLORIDE 0.9 % IJ SOLN
3.0000 mL | Freq: Two times a day (BID) | INTRAMUSCULAR | Status: DC
  Administered 2012-11-23: 3 mL via INTRAVENOUS

## 2012-11-22 MED ORDER — DILTIAZEM HCL 100 MG IV SOLR
5.0000 mg/h | INTRAVENOUS | Status: DC
  Administered 2012-11-22: 5 mg/h via INTRAVENOUS

## 2012-11-22 MED ORDER — DILTIAZEM HCL 50 MG/10ML IV SOLN: 10.0000 mg | Freq: Once | INTRAVENOUS | Status: DC

## 2012-11-22 MED ORDER — PEG 3350-KCL-NA BICARB-NACL 420 G PO SOLR
4000.0000 mL | Freq: Once | ORAL | Status: AC
  Administered 2012-11-23: 4000 mL via ORAL

## 2012-11-22 MED ORDER — THYROID 60 MG PO TABS
60.0000 mg | ORAL_TABLET | Freq: Every day | ORAL | Status: DC
Start: 2012-11-23 — End: 2012-11-24
  Administered 2012-11-24: 60 mg via ORAL
  Filled 2012-11-22 (×3): qty 1

## 2012-11-22 MED ORDER — SODIUM CHLORIDE 0.9 % IV SOLN: INTRAVENOUS | Status: DC

## 2012-11-22 MED ORDER — SODIUM CHLORIDE 0.9 % IV SOLN
Freq: Once | INTRAVENOUS | Status: AC
  Administered 2012-11-22: 11:00:00 via INTRAVENOUS

## 2012-11-22 MED ORDER — ONDANSETRON HCL 4 MG/2ML IJ SOLN: 4.0000 mg | Freq: Four times a day (QID) | INTRAMUSCULAR | Status: DC | PRN

## 2012-11-22 MED ORDER — ENOXAPARIN SODIUM 40 MG/0.4ML ~~LOC~~ SOLN
40.0000 mg | SUBCUTANEOUS | Status: DC
  Filled 2012-11-22: qty 0.4

## 2012-11-22 MED ORDER — SODIUM CHLORIDE 0.9 % IV SOLN
INTRAVENOUS | Status: AC
  Administered 2012-11-22: 17:00:00 via INTRAVENOUS

## 2012-11-22 MED ORDER — SODIUM CHLORIDE 0.9 % IV SOLN: 250.0000 mL | INTRAVENOUS | Status: DC | PRN

## 2012-11-22 MED ORDER — ASPIRIN EC 325 MG PO TBEC
325.0000 mg | DELAYED_RELEASE_TABLET | Freq: Every day | ORAL | Status: DC
  Filled 2012-11-22: qty 1

## 2012-11-22 MED ORDER — ONDANSETRON HCL 4 MG PO TABS: 4.0000 mg | ORAL_TABLET | Freq: Four times a day (QID) | ORAL | Status: DC | PRN

## 2012-11-22 MED ORDER — METOPROLOL TARTRATE 12.5 MG HALF TABLET
12.5000 mg | ORAL_TABLET | Freq: Two times a day (BID) | ORAL | Status: DC
  Administered 2012-11-22 – 2012-11-24 (×4): 12.5 mg via ORAL
  Filled 2012-11-22 (×5): qty 1

## 2012-11-22 MED ORDER — DILTIAZEM LOAD VIA INFUSION
10.0000 mg | Freq: Once | INTRAVENOUS | Status: AC
  Administered 2012-11-22: 10 mg via INTRAVENOUS
  Filled 2012-11-22: qty 10

## 2012-11-22 MED ORDER — SODIUM CHLORIDE 0.9 % IJ SOLN: 3.0000 mL | INTRAMUSCULAR | Status: DC | PRN

## 2012-11-22 MED ORDER — SODIUM CHLORIDE 0.9 % IV BOLUS (SEPSIS)
500.0000 mL | Freq: Once | INTRAVENOUS | Status: AC
  Administered 2012-11-22: 500 mL via INTRAVENOUS

## 2012-11-22 NOTE — Consult Note (Signed)
[facility] CONSULT Reason for consult: G.I. bleeding need for colonoscopy Referring Physician: [facility]. PCP: Dr. Audria [name]. Primary G.I.: Dr. Jeanne [name] Mackenzie is an 67 y.o. [name].  HPI: she was hospitalized recently for ischemic colitis and treated with fluids supportive care and antibiotics. CT scan showed inflammation of the descending and tranverse colon. Dr. Bosie Clos saw her in the office and given her history of colon polyps removed at prior colonoscopy in [place] and outpatient colonoscopy was scheduled. Patient went to the crap and began to feel lightheaded and dizzy with a history of her racing heart. She presented to the emergency room was found to have new onset atrial fibrillation with a rapid ventricular response that responded to a cardizem trip with conversion back to normal sinus rhythm. Patient has no history of prior heart disease. Given the fact that she is in and out of AFib, it was felt that she should go ahead to the colonoscopy in case she needed to be anti-coagulated. She completed her prep yesterday and stated that she began to feel dizzy and lightheaded. She has never had these problems with previous colonoscopy prep's  Past Medical History  Diagnosis Date  . Arthritis   . Thyroid disease   . Anxiety   . Osteoporosis     per patient questionable  . Hashimoto's thyroiditis 10/12/2012    Past Surgical History  Procedure Laterality Date  . Cosmetic surgery    . Abdominal hysterectomy    . Bladder prolapse surgery     . Wrist surgery      Family History  Problem Relation Age of Onset  . COPD Mother     emphysema  . Cancer Father     Social History:  reports that she has never smoked. She has never used smokeless tobacco. She reports that she does not drink alcohol or use illicit drugs.  Allergies:  Allergies  Allergen Reactions  . Clindamycin Hives  . Vancomycin Hcl Other (See Comments)    Not sure which medication gave her  whelps but this was administered with the clindamycin     Medications; . sodium chloride   Intravenous STAT  . aspirin EC  325 mg Oral Daily  . enoxaparin (LOVENOX) injection  40 mg Subcutaneous Q24H  . metoprolol tartrate  12.5 mg Oral BID  . sodium chloride  3 mL Intravenous Q12H  . sodium chloride  3 mL Intravenous Q12H  . [START ON 11/23/2012] thyroid  60 mg Oral Q breakfast   PRN Meds sodium chloride, ondansetron (ZOFRAN) IV, ondansetron, sodium chloride, zolpidem Results for orders placed during the hospital encounter of 11/22/12 (from the past 48 hour(s))  CBC WITH DIFFERENTIAL     Status: Abnormal   Collection Time    11/22/12  9:30 AM      Result Value Range   WBC 4.8  4.0 - 10.5 K/uL   RBC 4.78  3.87 - 5.11 MIL/uL   Hemoglobin 14.6  12.0 - 15.0 g/dL   HCT 40.9  81.1 - 91.4 %   MCV 92.3  78.0 - 100.0 fL   MCH 30.5  26.0 - 34.0 pg   MCHC 33.1  30.0 - 36.0 g/dL   RDW 78.2  95.6 - 21.3 %   Platelets 239  150 - 400 K/uL   Neutrophils Relative 80 (*) 43 - 77 %   Neutro Abs 3.9  1.7 - 7.7 K/uL   Lymphocytes Relative 15  12 - 46 %   Lymphs Abs 0.7  0.7 - 4.0 K/uL   Monocytes Relative 4  3 - 12 %   Monocytes Absolute 0.2  0.1 - 1.0 K/uL   Eosinophils Relative 0  0 - 5 %   Eosinophils Absolute 0.0  0.0 - 0.7 K/uL   Basophils Relative 0  0 - 1 %   Basophils Absolute 0.0  0.0 - 0.1 K/uL  COMPREHENSIVE METABOLIC PANEL     Status: Abnormal   Collection Time    11/22/12  9:30 AM      Result Value Range   Sodium 139  135 - 145 mEq/L   Potassium 3.9  3.5 - 5.1 mEq/L   Chloride 99  96 - 112 mEq/L   CO2 21  19 - 32 mEq/L   Glucose, Bld 62 (*) 70 - 99 mg/dL   BUN 23  6 - 23 mg/dL   Creatinine, Ser 5.62  0.50 - 1.10 mg/dL   Calcium 13.0  8.4 - 86.5 mg/dL   Total Protein 8.0  6.0 - 8.3 g/dL   Albumin 4.4  3.5 - 5.2 g/dL   AST 26  0 - 37 U/L   ALT 14  0 - 35 U/L   Alkaline Phosphatase 68  39 - 117 U/L   Total Bilirubin 0.7  0.3 - 1.2 mg/dL   GFR calc non Af Amer 86 (*) >90  mL/min   GFR calc Af Amer >90  >90 mL/min   Comment:            The eGFR has been calculated     using the CKD EPI equation.     This calculation has not been     validated in all clinical     situations.     eGFR's persistently     <90 mL/min signify     possible Chronic Kidney Disease.  PRO B NATRIURETIC PEPTIDE     Status: None   Collection Time    11/22/12  9:30 AM      Result Value Range   Pro B Natriuretic peptide (BNP) 64.8  0 - 125 pg/mL  TROPONIN I     Status: None   Collection Time    11/22/12  9:30 AM      Result Value Range   Troponin I <0.30  <0.30 ng/mL   Comment:            Due to the release kinetics of cTnI,     a negative result within the first hours     of the onset of symptoms does not rule out     myocardial infarction with certainty.     If myocardial infarction is still suspected,     repeat the test at appropriate intervals.  PROTIME-INR     Status: None   Collection Time    11/22/12  9:30 AM      Result Value Range   Prothrombin Time 12.8  11.6 - 15.2 seconds   INR 0.97  0.00 - 1.49  APTT     Status: None   Collection Time    11/22/12  9:30 AM      Result Value Range   aPTT 32  24 - 37 seconds    Dg Chest Port 1 View  11/22/2012  *RADIOLOGY REPORT*  Clinical Data: Short of breath  PORTABLE CHEST - 1 VIEW  Comparison: CT abdomen 10/12/2012  Findings: Normal mediastinum and cardiac silhouette.  Lungs are hyperinflated.  No effusion, infiltrate, or pneumothorax.  IMPRESSION:  Hyperinflated lungs.  No acute findings.   Original Report Authenticated By: Genevive Bi, M.D.                Blood pressure 95/40, pulse 79, temperature 98.2 F (36.8 C), temperature source Oral, resp. rate 18, height 5\' 5"  (1.651 m), weight 50.758 kg (111 lb 14.4 oz), SpO2 98.00%.  Physical exam:   General -- alert [name] in the acute distress CV: regular rate and rhythm without murmurs are gallops Lungs -- clear Abdomen -- soft  nontender   Assessment: 1. Rectal bleeding/ischemic colitis. This is suggested by CT scan on her recent admission. Agree that she does need colonoscopy rule out other problems particularly given her history of prior colon polyps. 2. History of prior colon polyps removed in [place] over 5 years ago 3. Chronic constipation. Probably contributing to her ischemic colitis 4. New onset atrial fibrillation. Converted immediately to NSR with Cardizem drip. Likely need further evaluation and treatment going for  Plan: we will proceed with colonoscopy tomorrow. Have discuss this again with the patient her husband. We'll give her a small amount of additional prep.   EDWARDS JR,JAMES L 11/22/2012, 5:15 PM

## 2012-11-22 NOTE — ED Notes (Addendum)
First attempt to call report. Bed is being prepared. rn will call back

## 2012-11-22 NOTE — ED Notes (Signed)
Pt states that she was preparing for a colonoscopy and this morning began having CP which since resolved.  States that she began having dizziness and nausea.  Pt was seen in triage with a HR of 170.

## 2012-11-22 NOTE — ED Notes (Signed)
First attempt to call report. rn will call back

## 2012-11-22 NOTE — ED Provider Notes (Addendum)
History     CSN: [id number]  Arrival date & time 11/22/12  3474   First MD Initiated Contact with Patient 11/22/12 [phone]      Chief Complaint  Patient presents with  . Dizziness  . Nausea    (Consider location/radiation/quality/duration/timing/severity/associated sxs/prior treatment) HPI Comments: Patient presents to the ER for evaluation of palpitations, racing heartbeat, dizziness, shortness of breath and chest pain. Symptoms began this morning upon wakening. Patient reports that she was scheduled for colonoscopy today, did her bowel prep yesterday. She was having loose stool secondary to the bowel prep, but none of these symptoms were present yesterday. Patient had recent GI bleed, anoscopy today was for followup of the bleeding. She has not had any bleeding recently, however. Patient's chest pain was tightness present earlier, this has not resolved.   Past Medical History  Diagnosis Date  . Arthritis   . Thyroid disease   . Anxiety   . Osteoporosis     per patient questionable  . Hashimoto's thyroiditis 10/12/2012    Past Surgical History  Procedure Laterality Date  . Cosmetic surgery    . Abdominal hysterectomy    . Bladder prolapse surgery     . Wrist surgery      Family History  Problem Relation Age of Onset  . COPD Mother     emphysema  . Cancer Father     History  Substance Use Topics  . Smoking status: Never Smoker   . Smokeless tobacco: Not on file  . Alcohol Use: No    OB History   Grav Para Term Preterm Abortions TAB SAB Ect Mult Living                  Review of Systems  Respiratory: Positive for shortness of breath.   Cardiovascular: Positive for chest pain and palpitations.  Neurological: Positive for dizziness.  All other systems reviewed and are negative.    Allergies  Clindamycin and Vancomycin hcl  Home Medications   Current Outpatient Rx  Name  Route  Sig  Dispense  Refill  . cholecalciferol (VITAMIN D) 1000 UNITS tablet    Oral   Take 1,000 Units by mouth daily.         Marland Kitchen omega-3 acid ethyl esters (LOVAZA) 1 G capsule   Oral   Take 1 g by mouth 2 (two) times daily.         Marland Kitchen thyroid (ARMOUR) 60 MG tablet   Oral   Take 60 mg by mouth daily.         Marland Kitchen zolpidem (AMBIEN) 10 MG tablet   Oral   Take 5 mg by mouth at bedtime as needed for sleep.           BP 116/65  Pulse 122  Temp(Src) 98 F (36.7 C) (Oral)  Resp 18  SpO2 100%  Physical Exam  Constitutional: She is oriented to person, place, and time. She appears well-developed and well-nourished. She appears distressed.  HENT:  Head: Normocephalic and atraumatic.  Right Ear: Hearing normal.  Nose: Nose normal.  Mouth/Throat: Oropharynx is clear and moist and mucous membranes are normal.  Eyes: Conjunctivae and EOM are normal. Pupils are equal, round, and reactive to light.  Neck: Normal range of motion. Neck supple.  Cardiovascular: S1 normal and S2 normal.  An irregularly irregular rhythm present. Tachycardia present.  Exam reveals no gallop and no friction rub.   No murmur heard. Pulmonary/Chest: Effort normal and breath sounds normal. No respiratory  distress. She exhibits no tenderness.  Abdominal: Soft. Normal appearance and bowel sounds are normal. There is no hepatosplenomegaly. There is no tenderness. There is no rebound, no guarding, no tenderness at McBurney's point and negative Murphy's sign. No hernia.  Musculoskeletal: Normal range of motion.  Neurological: She is alert and oriented to person, place, and time. She has normal strength. No cranial nerve deficit or sensory deficit. Coordination normal. GCS eye subscore is 4. GCS verbal subscore is 5. GCS motor subscore is 6.  Skin: Skin is warm, dry and intact. No rash noted. No cyanosis.  Psychiatric: She has a normal mood and affect. Her speech is normal and behavior is normal. Thought content normal.    ED Course  Procedures (including critical care time)  EKG#1:  Date:  11/22/2012  Rate: 135  Rhythm: atrial fibrillation  QRS Axis: normal  Intervals: no p waves  ST/T Wave abnormalities: nonspecific ST/T changes  Conduction Disutrbances:none  Narrative Interpretation:   Old EKG Reviewed: none available  EKG#2  Date: 11/22/2012  Rate: 90  Rhythm: atrial fibrillation  QRS Axis: normal  Intervals: normal  ST/T Wave abnormalities: normal  Conduction Disutrbances:none  Narrative Interpretation:   Old EKG Reviewed: rate improved, no ST changes      Labs Reviewed  CBC WITH DIFFERENTIAL - Abnormal; Notable for the following:    Neutrophils Relative 80 (*)    All other components within normal limits  COMPREHENSIVE METABOLIC PANEL - Abnormal; Notable for the following:    Glucose, Bld 62 (*)    GFR calc non Af Amer 86 (*)    All other components within normal limits  PRO B NATRIURETIC PEPTIDE  TROPONIN I  PROTIME-INR  APTT   Dg Chest Port 1 View  11/22/2012  *RADIOLOGY REPORT*  Clinical Data: Short of breath  PORTABLE CHEST - 1 VIEW  Comparison: CT abdomen 10/12/2012  Findings: Normal mediastinum and cardiac silhouette.  Lungs are hyperinflated.  No effusion, infiltrate, or pneumothorax.  IMPRESSION:  Hyperinflated lungs.  No acute findings.   Original Report Authenticated By: Genevive Bi, M.D.      Diagnosis: Atrial fibrillation with rapid ventricular response    MDM  Patient comes to the ER for evaluation of weakness, dizziness and heart palpitations. She was found to be tachycardic in triage and brought straight back to resuscitation B. Patient placed on a monitor and found to be in atrial fibrillation with rapid ventricular response. Patient has had some minor chest discomfort related to the tachycardia. She was not expressing pain at time of arrival. She had nonspecific rate-related changes on EKG, no clear ischemia or infarct. Patient has responded to Cardizem. She was Mr. 10 mg IV bolus and placed on a drip at 5 mg per hour. Blood  pressure has dropped slightly into the 90-100 systolic range, but she is tolerating it well. Heart rate is now 90s. Patient will require hospitalization for further management. Discussed with Dr. Benjamine Mola, will admit to SDU.        Gilda Crease, MD 11/22/12 1135  Gilda Crease, MD 11/22/12 1201

## 2012-11-22 NOTE — ED Notes (Signed)
Admitted md at bedside. Monitor and strip printed. Pt converted to NSR at 1207

## 2012-11-22 NOTE — ED Notes (Signed)
Pt alert and oriented x4. Respirations even and unlabored, bilateral symmetrical rise and fall of chest. Skin warm and dry. In no acute distress. Denies needs.   

## 2012-11-22 NOTE — H&P (Signed)
[facility] History and Physical  Lorriann Hansmann RUE:[id number] DOB: 21-Feb-1946 DOA: 11/22/2012  Referring physician: er PCP: Dow Adolph, MD  Specialists: [facility]- [facility]  Chief Complaint: weakness  HPI: Mackenzie [name] is a 67 y.o. female  Who recently was admitted with a GI bleed due to ischemic colitis.  She was in preparation for a colonoscopy today when she felt light headed, dizzy and a racing heart rate.  She endorsed chest pain and tightness.    In the ER she was found to be tachycardic.  She was place on a cardizem gtt.  At around 12 she converted to NSR.    She does report back in January when she was in the hospital, they were giving her double her armour dose (120 vs 60).  She is now comfortable.  No CP, no SOB, no fever, no chills  I spoke with GI (edwards) who agreed to see patient and possibly scope patient to help determine if anticoagulation is possible if patient converts back to atrial fib.   Review of Systems: all systems reviewed, negative unless stated above    Past Medical History  Diagnosis Date  . Arthritis   . Thyroid disease   . Anxiety   . Osteoporosis     per patient questionable  . Hashimoto's thyroiditis 10/12/2012   Past Surgical History  Procedure Laterality Date  . Cosmetic surgery    . Abdominal hysterectomy    . Bladder prolapse surgery     . Wrist surgery     Social History:  reports that she has never smoked. She has never used smokeless tobacco. She reports that she does not drink alcohol or use illicit drugs. From home  Allergies  Allergen Reactions  . Clindamycin Hives  . Vancomycin Hcl Other (See Comments)    Not sure which medication gave her whelps but this was administered with the clindamycin     Family History  Problem Relation Age of Onset  . COPD Mother     emphysema  . Cancer Father       Prior to Admission medications   Medication Sig Start Date End Date Taking? Authorizing Jule Whitsel  cholecalciferol  (VITAMIN D) 1000 UNITS tablet Take 1,000 Units by mouth daily.   Yes Historical Jones Viviani, MD  omega-3 acid ethyl esters (LOVAZA) 1 G capsule Take 1 g by mouth 2 (two) times daily.   Yes Historical Dorothea Yow, MD  thyroid (ARMOUR) 60 MG tablet Take 60 mg by mouth daily.   Yes Historical Jamaul Heist, MD  zolpidem (AMBIEN) 10 MG tablet Take 5 mg by mouth at bedtime as needed for sleep.   Yes Historical Delainy Mcelhiney, MD   Physical Exam: Filed Vitals:   11/22/12 1239 11/22/12 1248 11/22/12 1300 11/22/12 1415  BP: 104/44 96/46 97/38  110/52  Pulse: 91 91 95 90  Temp:  98 F (36.7 C)    TempSrc:  Oral    Resp: 19 18 20 21   SpO2:  100%  99%     General:  A+Ox3, NAD  Eyes: wnl  ENT: wnl  Neck: supple  Cardiovascular: rrr (converted about 12:30 PM)  Respiratory: clear anterior  Abdomen: +BS, soft, NT  Skin: no rashes or lesions  Musculoskeletal: moves all 4 ext  Psychiatric: no SI/no HI  Neurologic: CH 2-12 intact  Labs on Admission:  Basic Metabolic Panel:  Recent Labs Lab 11/22/12 0930  NA 139  K 3.9  CL 99  CO2 21  GLUCOSE 62*  BUN 23  CREATININE 0.75  CALCIUM  10.1   Liver Function Tests:  Recent Labs Lab 11/22/12 0930  AST 26  ALT 14  ALKPHOS 68  BILITOT 0.7  PROT 8.0  ALBUMIN 4.4   No results found for this basename: LIPASE, AMYLASE,  in the last 168 hours No results found for this basename: AMMONIA,  in the last 168 hours CBC:  Recent Labs Lab 11/22/12 0930  WBC 4.8  NEUTROABS 3.9  HGB 14.6  HCT 44.1  MCV 92.3  PLT 239   Cardiac Enzymes:  Recent Labs Lab 11/22/12 0930  TROPONINI <0.30    BNP (last 3 results)  Recent Labs  11/22/12 0930  PROBNP 64.8   CBG: No results found for this basename: GLUCAP,  in the last 168 hours  Radiological Exams on Admission: Dg Chest Port 1 View  11/22/2012  *RADIOLOGY REPORT*  Clinical Data: Short of breath  PORTABLE CHEST - 1 VIEW  Comparison: CT abdomen 10/12/2012  Findings: Normal mediastinum and  cardiac silhouette.  Lungs are hyperinflated.  No effusion, infiltrate, or pneumothorax.  IMPRESSION:  Hyperinflated lungs.  No acute findings.   Original Report Authenticated By: Genevive Bi, M.D.     EKG: Independently reviewed. Currently sinus- admission EKG is atrial fib with RVR  Assessment/Plan Principal Problem:   Atrial fibrillation Active Problems:   Hashimoto's thyroiditis   GI bleed   1. A fib- off cardizem gtt, add low dose BB- if BP tolerates.  Tele; Echo, cycle cardiac enzymes, if patient goes back into a fib will need cardiology consult 2. hasimoto's thyroiditis- check TSH 3. Recent GI bleed- asked GI to see and scope, will place patient on clear diet until see by them  GI consulted in ER for colonoscopy- recent admission for GI bleed  Code Status: full Family Communication: patient at bedside Disposition Plan: home 1-2 days  Time spent: 70 min  VANN, JESSICA [facility] Pager [phone]  If 7PM-7AM, please contact night-coverage [email or web address] Password [facility] 11/22/2012, 2:30 PM

## 2012-11-22 NOTE — ED Notes (Signed)
Report given to [facility], rn on floor

## 2012-11-23 ENCOUNTER — Encounter (HOSPITAL_COMMUNITY): Payer: Self-pay | Admitting: *Deleted

## 2012-11-23 ENCOUNTER — Encounter (HOSPITAL_COMMUNITY)
Admission: EM | Disposition: A | Discharge status: Home / Self Care | Payer: Self-pay | Source: Home / Self Care | Attending: Internal Medicine

## 2012-11-23 DIAGNOSIS — I4891 Unspecified atrial fibrillation: Secondary | ICD-10-CM

## 2012-11-23 HISTORY — PX: COLONOSCOPY: SHX5424

## 2012-11-23 LAB — CBC
Hemoglobin: 11 g/dL — ABNORMAL LOW (ref 12.0–15.0)
MCH: 30.6 pg (ref 26.0–34.0)
Platelets: 193 10*3/uL (ref 150–400)
RBC: 3.6 MIL/uL — ABNORMAL LOW (ref 3.87–5.11)
WBC: 3.9 10*3/uL — ABNORMAL LOW (ref 4.0–10.5)

## 2012-11-23 LAB — BASIC METABOLIC PANEL
CO2: 18 mEq/L — ABNORMAL LOW (ref 19–32)
Calcium: 8.6 mg/dL (ref 8.4–10.5)
Chloride: 106 mEq/L (ref 96–112)
Glucose, Bld: 64 mg/dL — ABNORMAL LOW (ref 70–99)
Potassium: 3.9 mEq/L (ref 3.5–5.1)
Sodium: 139 mEq/L (ref 135–145)

## 2012-11-23 LAB — TROPONIN I: Troponin I: <0.3 ng/mL (ref <0.30)

## 2012-11-23 SURGERY — COLONOSCOPY
Anesthesia: Moderate Sedation

## 2012-11-23 MED ORDER — POLYETHYLENE GLYCOL 3350 17 G PO PACK
17.0000 g | PACK | Freq: Every day | ORAL | Status: DC
  Filled 2012-11-23 (×2): qty 1

## 2012-11-23 MED ORDER — MIDAZOLAM HCL 10 MG/2ML IJ SOLN
INTRAMUSCULAR | Status: AC
  Filled 2012-11-23: qty 2

## 2012-11-23 MED ORDER — SODIUM CHLORIDE 0.9 % IV SOLN
INTRAVENOUS | Status: DC
  Administered 2012-11-23: 11:00:00 via INTRAVENOUS

## 2012-11-23 MED ORDER — FENTANYL CITRATE 0.05 MG/ML IJ SOLN
INTRAMUSCULAR | Status: AC
  Filled 2012-11-23: qty 2

## 2012-11-23 MED ORDER — FENTANYL CITRATE 0.05 MG/ML IJ SOLN
INTRAMUSCULAR | Status: DC | PRN
  Administered 2012-11-23 (×3): 25 ug via INTRAVENOUS

## 2012-11-23 MED ORDER — MIDAZOLAM HCL 5 MG/5ML IJ SOLN
INTRAMUSCULAR | Status: DC | PRN
  Administered 2012-11-23: 1 mg via INTRAVENOUS
  Administered 2012-11-23: 2 mg via INTRAVENOUS
  Administered 2012-11-23: 1 mg via INTRAVENOUS
  Administered 2012-11-23: 2 mg via INTRAVENOUS

## 2012-11-23 MED ORDER — DIPHENHYDRAMINE HCL 50 MG/ML IJ SOLN
INTRAMUSCULAR | Status: AC
  Filled 2012-11-23: qty 1

## 2012-11-23 MED ORDER — SODIUM CHLORIDE 0.9 % IV SOLN
INTRAVENOUS | Status: DC
  Administered 2012-11-23: 500 mL via INTRAVENOUS

## 2012-11-23 NOTE — Interval H&P Note (Signed)
History and Physical Interval Note:  11/23/2012 2:36 PM  Mackenzie [name]  has presented today for surgery, with the diagnosis of GI bleed  The various methods of treatment have been discussed with the patient and family. After consideration of risks, benefits and other options for treatment, the patient has consented to  Procedure(s): COLONOSCOPY (N/A) as a surgical intervention .  The patient's history has been reviewed, patient examined, no change in status, stable for surgery.  I have reviewed the patient's chart and labs.  Questions were answered to the patient's satisfaction.     EDWARDS JR,JAMES L

## 2012-11-23 NOTE — H&P (View-Only) (Signed)
[facility] CONSULT Reason for consult: G.I. bleeding need for colonoscopy Referring Physician: [facility]. PCP: Dr. McPherson. Primary G.I.: Dr. Schooler  Mackenzie [name] is an 67 y.o. female.  HPI: she was hospitalized recently for ischemic colitis and treated with fluids supportive care and antibiotics. CT scan showed inflammation of the descending and tranverse colon. Dr. Schooler saw her in the office and given her history of colon polyps removed at prior colonoscopy in [place] and outpatient colonoscopy was scheduled. Patient went to the crap and began to feel lightheaded and dizzy with a history of her racing heart. She presented to the emergency room was found to have new onset atrial fibrillation with a rapid ventricular response that responded to a cardizem trip with conversion back to normal sinus rhythm. Patient has no history of prior heart disease. Given the fact that she is in and out of AFib, it was felt that she should go ahead to the colonoscopy in case she needed to be anti-coagulated. She completed her prep yesterday and stated that she began to feel dizzy and lightheaded. She has never had these problems with previous colonoscopy prep's  Past Medical History  Diagnosis Date  . Arthritis   . Thyroid disease   . Anxiety   . Osteoporosis     per patient questionable  . Hashimoto's thyroiditis 10/12/2012    Past Surgical History  Procedure Laterality Date  . Cosmetic surgery    . Abdominal hysterectomy    . Bladder prolapse surgery     . Wrist surgery      Family History  Problem Relation Age of Onset  . COPD Mother     emphysema  . Cancer Father     Social History:  reports that she has never smoked. She has never used smokeless tobacco. She reports that she does not drink alcohol or use illicit drugs.  Allergies:  Allergies  Allergen Reactions  . Clindamycin Hives  . Vancomycin Hcl Other (See Comments)    Not sure which medication gave her  whelps but this was administered with the clindamycin     Medications; . sodium chloride   Intravenous STAT  . aspirin EC  325 mg Oral Daily  . enoxaparin (LOVENOX) injection  40 mg Subcutaneous Q24H  . metoprolol tartrate  12.5 mg Oral BID  . sodium chloride  3 mL Intravenous Q12H  . sodium chloride  3 mL Intravenous Q12H  . [START ON 11/23/2012] thyroid  60 mg Oral Q breakfast   PRN Meds sodium chloride, ondansetron (ZOFRAN) IV, ondansetron, sodium chloride, zolpidem Results for orders placed during the hospital encounter of 11/22/12 (from the past 48 hour(s))  CBC WITH DIFFERENTIAL     Status: Abnormal   Collection Time    11/22/12  9:30 AM      Result Value Range   WBC 4.8  4.0 - 10.5 K/uL   RBC 4.78  3.87 - 5.11 MIL/uL   Hemoglobin 14.6  12.0 - 15.0 g/dL   HCT 44.1  36.0 - 46.0 %   MCV 92.3  78.0 - 100.0 fL   MCH 30.5  26.0 - 34.0 pg   MCHC 33.1  30.0 - 36.0 g/dL   RDW 13.2  11.5 - 15.5 %   Platelets 239  150 - 400 K/uL   Neutrophils Relative 80 (*) 43 - 77 %   Neutro Abs 3.9  1.7 - 7.7 K/uL   Lymphocytes Relative 15  12 - 46 %   Lymphs Abs 0.7    0.7 - 4.0 K/uL   Monocytes Relative 4  3 - 12 %   Monocytes Absolute 0.2  0.1 - 1.0 K/uL   Eosinophils Relative 0  0 - 5 %   Eosinophils Absolute 0.0  0.0 - 0.7 K/uL   Basophils Relative 0  0 - 1 %   Basophils Absolute 0.0  0.0 - 0.1 K/uL  COMPREHENSIVE METABOLIC PANEL     Status: Abnormal   Collection Time    11/22/12  9:30 AM      Result Value Range   Sodium 139  135 - 145 mEq/L   Potassium 3.9  3.5 - 5.1 mEq/L   Chloride 99  96 - 112 mEq/L   CO2 21  19 - 32 mEq/L   Glucose, Bld 62 (*) 70 - 99 mg/dL   BUN 23  6 - 23 mg/dL   Creatinine, Ser 0.75  0.50 - 1.10 mg/dL   Calcium 10.1  8.4 - 10.5 mg/dL   Total Protein 8.0  6.0 - 8.3 g/dL   Albumin 4.4  3.5 - 5.2 g/dL   AST 26  0 - 37 U/L   ALT 14  0 - 35 U/L   Alkaline Phosphatase 68  39 - 117 U/L   Total Bilirubin 0.7  0.3 - 1.2 mg/dL   GFR calc non Af Amer 86 (*) >90  mL/min   GFR calc Af Amer >90  >90 mL/min   Comment:            The eGFR has been calculated     using the CKD EPI equation.     This calculation has not been     validated in all clinical     situations.     eGFR's persistently     <90 mL/min signify     possible Chronic Kidney Disease.  PRO B NATRIURETIC PEPTIDE     Status: None   Collection Time    11/22/12  9:30 AM      Result Value Range   Pro B Natriuretic peptide (BNP) 64.8  0 - 125 pg/mL  TROPONIN I     Status: None   Collection Time    11/22/12  9:30 AM      Result Value Range   Troponin I <0.30  <0.30 ng/mL   Comment:            Due to the release kinetics of cTnI,     a negative result within the first hours     of the onset of symptoms does not rule out     myocardial infarction with certainty.     If myocardial infarction is still suspected,     repeat the test at appropriate intervals.  PROTIME-INR     Status: None   Collection Time    11/22/12  9:30 AM      Result Value Range   Prothrombin Time 12.8  11.6 - 15.2 seconds   INR 0.97  0.00 - 1.49  APTT     Status: None   Collection Time    11/22/12  9:30 AM      Result Value Range   aPTT 32  24 - 37 seconds    Dg Chest Port 1 View  11/22/2012  *RADIOLOGY REPORT*  Clinical Data: Short of breath  PORTABLE CHEST - 1 VIEW  Comparison: CT abdomen 10/12/2012  Findings: Normal mediastinum and cardiac silhouette.  Lungs are hyperinflated.  No effusion, infiltrate, or pneumothorax.  IMPRESSION:    Hyperinflated lungs.  No acute findings.   Original Report Authenticated By: Stewart Edmunds, M.D.                Blood pressure 95/40, pulse 79, temperature 98.2 F (36.8 C), temperature source Oral, resp. rate 18, height 5' 5" (1.651 m), weight 50.758 kg (111 lb 14.4 oz), SpO2 98.00%.  Physical exam:   General -- alert white female in the acute distress CV: regular rate and rhythm without murmurs are gallops Lungs -- clear Abdomen -- soft  nontender   Assessment: 1. Rectal bleeding/ischemic colitis. This is suggested by CT scan on her recent admission. Agree that she does need colonoscopy rule out other problems particularly given her history of prior colon polyps. 2. History of prior colon polyps removed in [place] over 5 years ago 3. Chronic constipation. Probably contributing to her ischemic colitis 4. New onset atrial fibrillation. Converted immediately to NSR with Cardizem drip. Likely need further evaluation and treatment going for  Plan: we will proceed with colonoscopy tomorrow. Have discuss this again with the patient her husband. We'll give her a small amount of additional prep.   EDWARDS JR,JAMES L 11/22/2012, 5:15 PM      

## 2012-11-23 NOTE — Progress Notes (Signed)
   CARE MANAGEMENT NOTE 11/23/2012  Patient:  Mackenzie [name], Mackenzie [name]   Account Number:  [id number]  Date Initiated:  11/23/2012  Documentation initiated by:  Jiles Crocker  Subjective/Objective Assessment:   ADMITTED WITH ATRIAL FIB, DIZZINESS, NAUSEA     Action/Plan:   PCP: Dow Adolph, MD  LIVES AT HOME WITH SPOUSE   Anticipated DC Date:  11/30/2012   Anticipated DC Plan:  HOME/SELF CARE      DC Planning Services  CM consult         Status of service:  In process, will continue to follow Medicare Important Message given?  NA - LOS <3 / Initial given by admissions (If response is "NO", the following Medicare IM given date fields will be blank) Per UR Regulation:  Reviewed for med. necessity/level of care/duration of stay Comments:  11/23/2012- B CHANDLER RN,BSN,MHA

## 2012-11-23 NOTE — Progress Notes (Signed)
[facility] PROGRESS NOTE  Mackenzie [name] JXB:[id number] DOB: Apr 08, 1946 DOA: 11/22/2012 PCP: Dow Adolph, MD  Assessment/Plan:  #1 paroxysmal new onset atrial fibrillation Questionable etiology. May be secondary to volume depletion secondary to prep. Cardiac enzymes are negative x3. TSH is within normal limits. 2-D echo is pending. Patient currently in normal sinus rhythm. Continue low-dose beta blocker for rate control. Patient may need aspirin however will defer to GI post colonoscopy.  #2 Hashimoto's thyroiditis TSH within normal limits. Continue current dose of armour.  #3 recent history of ischemic colitis/GI bleed Patient has been seen in consultation by gastroenterology. Patient for colonoscopy today.  #4 anxiety/depression Stable.  #5. Prophylaxis SCDs for DVT prophylaxis.   Code Status: Full Family Communication: Updated patient no family at bedside. Disposition Plan: Home when medically stable.   Consultants:  Gastroenterology: Dr. Randa Evens 11/22/2012  Procedures:  Colonoscopy pending  Antibiotics:  None.  HPI/Subjective: Patient denies any dizziness. No chest pain. No shortness of breath. Patient with no complaints.  Objective: Filed Vitals:   11/22/12 1638 11/22/12 1700 11/22/12 2157 11/23/12 0538  BP: 95/40 104/58 99/41 101/40  Pulse: 79  65 65  Temp: 98.2 F (36.8 C)  98.2 F (36.8 C) 97.9 F (36.6 C)  TempSrc: Oral  Oral Oral  Resp: 18  18 18   Height: 5\' 5"  (1.651 m)     Weight: 50.758 kg (111 lb 14.4 oz)     SpO2: 98%  100% 97%    Intake/Output Summary (Last 24 hours) at 11/23/12 1107 Last data filed at 11/22/12 2224  Gross per 24 hour  Intake      0 ml  Output      3 ml  Net     -3 ml   Filed Weights   11/22/12 1638  Weight: 50.758 kg (111 lb 14.4 oz)    Exam:   General:  NAD  Cardiovascular: RRR  Respiratory: CTAB  Abdomen: Soft/NT/ND/+BS  Extremities: No clubbing cyanosis or edema.  Data Reviewed: Basic  Metabolic Panel:  Recent Labs Lab 11/22/12 0930 11/23/12 0450  NA 139 139  K 3.9 3.9  CL 99 106  CO2 21 18*  GLUCOSE 62* 64*  BUN 23 13  CREATININE 0.75 0.63  CALCIUM 10.1 8.6   Liver Function Tests:  Recent Labs Lab 11/22/12 0930  AST 26  ALT 14  ALKPHOS 68  BILITOT 0.7  PROT 8.0  ALBUMIN 4.4   No results found for this basename: LIPASE, AMYLASE,  in the last 168 hours No results found for this basename: AMMONIA,  in the last 168 hours CBC:  Recent Labs Lab 11/22/12 0930 11/23/12 0450  WBC 4.8 3.9*  NEUTROABS 3.9  --   HGB 14.6 11.0*  HCT 44.1 32.7*  MCV 92.3 90.8  PLT 239 193   Cardiac Enzymes:  Recent Labs Lab 11/22/12 0930 11/22/12 1647 11/22/12 2250 11/23/12 0450  TROPONINI <0.30 <0.30 <0.30 <0.30   BNP (last 3 results)  Recent Labs  11/22/12 0930  PROBNP 64.8   CBG: No results found for this basename: GLUCAP,  in the last 168 hours  No results found for this or any previous visit (from the past 240 hour(s)).   Studies: Dg Chest Port 1 View  11/22/2012  *RADIOLOGY REPORT*  Clinical Data: Short of breath  PORTABLE CHEST - 1 VIEW  Comparison: CT abdomen 10/12/2012  Findings: Normal mediastinum and cardiac silhouette.  Lungs are hyperinflated.  No effusion, infiltrate, or pneumothorax.  IMPRESSION:  Hyperinflated lungs.  No  acute findings.   Original Report Authenticated By: Genevive Bi, M.D.     Scheduled Meds: . metoprolol tartrate  12.5 mg Oral BID  . sodium chloride  3 mL Intravenous Q12H  . sodium chloride  3 mL Intravenous Q12H  . thyroid  60 mg Oral Q breakfast   Continuous Infusions: . sodium chloride    . sodium chloride      Principal Problem:   Atrial fibrillation Active Problems:   Palpitations   Anxiety and depression   Hashimoto's thyroiditis   GI bleed    Time spent: > 30 mins    [facility]  [facility] Pager [phone]. If 7PM-7AM, please contact night-coverage at [email or web address], password  [facility] 11/23/2012, 11:07 AM  LOS: 1 day

## 2012-11-23 NOTE — Progress Notes (Signed)
*  PRELIMINARY RESULTS* Echocardiogram 2D Echocardiogram has been performed.  Mackenzie [name] 11/23/2012, 10:25 AM

## 2012-11-23 NOTE — Op Note (Signed)
[facility] [address] [place], [address]   COLONOSCOPY PROCEDURE REPORT  PATIENT: Mackenzie [name], Mackenzie [name]  MR#: [id number] BIRTHDATE: November 19, 1945 , 66  yrs. old GENDER: Female ENDOSCOPIST: Carman Ching, MD REFERRED BY:   [facility]. PCP: Dr. Audria Nine PROCEDURE DATE:  11/23/2012 PROCEDURE:  Colonoscopy ASA CLASS:    class 2 INDICATIONS:  rectal bleeding with the recent clinical ischemic colitis and prior history of colon [place]ps [place] MEDICATIONS:fentanyl 75 mcg, versed 6 mg  DESCRIPTION OF PROCEDURE: In left lateral decubitus position a digital exam was performed in the Pentax adult colonoscope inserted and advance.. The prep was quite good. We were able to reach the cecum without difficult. Appendices orifice and ICV were seen. The scope was withdrawn. There were a few scattered diverticula can be descending in sigmoid colon. These were minimal. No polyps, tumor, or masses were seen. There was no sign of ischemic colitis. Hemorrhoids were seen in the rectum upon retroflex view. The scope was withdrawn.     COMPLICATIONS: None  ENDOSCOPIC IMPRESSION: 1. G.I. bleeding. Probably due to ischemic colitis no other significant findings 2. Scatter diverticulosis 3. Internal hemorrhoids 4. History of colon polyps. No polyps in on this colonoscopy  RECOMMENDATIONS: will resume diet and recommend repeating colonoscopy in 5 years Bacall said history of prior: polyps. OK to be discharged from our perspective.    _______________________________ eSigned:  Carman Ching, MD 11/23/2012 3:20 PM

## 2012-11-24 ENCOUNTER — Encounter (HOSPITAL_COMMUNITY): Payer: Self-pay | Admitting: Gastroenterology

## 2012-11-24 LAB — CBC
MCH: 31.3 pg (ref 26.0–34.0)
MCHC: 35.1 g/dL (ref 30.0–36.0)
MCV: 89.3 fL (ref 78.0–100.0)
Platelets: 211 10*3/uL (ref 150–400)
RDW: 13.2 % (ref 11.5–15.5)

## 2012-11-24 LAB — BASIC METABOLIC PANEL
Calcium: 9.2 mg/dL (ref 8.4–10.5)
Creatinine, Ser: 0.61 mg/dL (ref 0.50–1.10)
GFR calc non Af Amer: >90 mL/min (ref >90)
Sodium: 139 mEq/L (ref 135–145)

## 2012-11-24 MED ORDER — ASPIRIN 81 MG PO CHEW
81.0000 mg | CHEWABLE_TABLET | Freq: Every day | ORAL | Status: DC
  Administered 2012-11-24: 81 mg via ORAL
  Filled 2012-11-24: qty 1

## 2012-11-24 MED ORDER — METOPROLOL TARTRATE 12.5 MG HALF TABLET: 12.5000 mg | ORAL_TABLET | Freq: Two times a day (BID) | ORAL | Status: DC

## 2012-11-24 MED ORDER — ASPIRIN 81 MG PO CHEW: 81.0000 mg | CHEWABLE_TABLET | Freq: Every day | ORAL | Status: AC

## 2012-11-24 MED ORDER — POLYETHYLENE GLYCOL 3350 17 G PO PACK: 17.0000 g | PACK | Freq: Every day | ORAL | Status: AC

## 2012-11-24 NOTE — Discharge Summary (Signed)
Physician Discharge Summary  Mackenzie [name] GNF:[id number] DOB: 04-01-1946 DOA: 11/22/2012  PCP: Dow Adolph, MD  Admit date: 11/22/2012 Discharge date: 11/24/2012  Time spent: 60 minutes  Recommendations for Outpatient Follow-up:  1. Follow up with PCP in 1 week 2. Follow up with Cardiology Tereso Newcomer, PA 11/30/12 for follow up on new onset afib. 3. Follow up with Dr Bosie Clos as needed  Discharge Diagnoses:  Principal Problem:   Atrial fibrillation Active Problems:   Palpitations   Anxiety and depression   Hashimoto's thyroiditis   GI bleed   Discharge Condition: Stable  Diet recommendation: Regular  Filed Weights   11/22/12 1638  Weight: 50.758 kg (111 lb 14.4 oz)    History of present illness:  Who recently was admitted with a GI bleed due to ischemic colitis. She was in preparation for a colonoscopy today when she felt light headed, dizzy and a racing heart rate. She endorsed chest pain and tightness.  In the ER she was found to be tachycardic. She was place on a cardizem gtt. At around 12 she converted to NSR.  She does report back in January when she was in the hospital, they were giving her double her armour dose (120 vs 60).  She is now comfortable. No CP, no SOB, no fever, no chills  I spoke with GI (edwards) who agreed to see patient and possibly scope patient to help determine if anticoagulation is possible if patient converts back to atrial fib.      Hospital Course:  #1 paroxysmal new onset atrial fibrillation  Patient was admitted with new onset atrial fibrillation with unknown etiology. It was felt patient's atrial fibrillation may be secondary to volume depletion secondary to prep. Cardiac enzymes were negative x3. TSH is within normal limits. 2-D echo with EF 60-65% . Patient was initially placed on cardizem drip and subsequently converted to NSR. Patient placed on low-dose beta blocker and remained in NSR. Patient subsequently placed on ASA 81 mg daily.  Patient will follow up with cardiology as outpatient.  #2 Hashimoto's thyroiditis  TSH within normal limits. Continued on home dose of armour.  #3 recent history of ischemic colitis/GI bleed  Patient has been seen in consultation by gastroenterology and underwent colonoscopy on 11/23/12 which showed scattered diverticulosis, internal hemorrhoids. It was recommended that patient undergo repeat colonoscopy in 5 years.  #4 anxiety/depression  Stable throughout hospitalization.  The rest of patient's chronic issues remained stable.   Procedures:  2-D echo in 11/23/2012  Colonoscopy 11/23/2012  Consultations:  Gastroenterology: Dr. Randa Evens 11/22/12  Discha open trge Exam: Filed Vitals:   11/23/12 1630 11/23/12 1700 11/23/12 2201 11/24/12 0549  BP: 98/60 102/58 105/45 112/50  Pulse:   72 62  Temp:   98 F (36.7 C) 97.8 F (36.6 C)  TempSrc:   Oral Oral  Resp: 18 18 16 16   Height:      Weight:      SpO2:   100% 98%    General: NAD Cardiovascular: RRR Respiratory: CTAB  And abdomen: Soft, nontender, nondistended, positive bowel sounds.  Discharge Instructions      Discharge Orders   Future Appointments [facility] Department Dept Phone   11/30/2012 3:40 PM Beatrice Lecher, PA-C [facility] Main Office [place]) [phone]   03/02/2013 9:15 AM Maurice March, MD [facility] [phone]   Future Orders Complete By Expires     Diet general  As directed     Discharge instructions  As directed  Comments:      fOLLOW UP WITH MCPHERSON,BARBARA, MD IN 1 WEEK.    Increase activity slowly  As directed         Medication List    TAKE these medications       aspirin 81 MG chewable tablet  Chew 1 tablet (81 mg total) by mouth daily.     cholecalciferol 1000 UNITS tablet  Commonly known as:  VITAMIN D  Take 1,000 Units by mouth daily.     metoprolol tartrate 12.5 mg Tabs  Commonly known as:  LOPRESSOR  Take 0.5 tablets (12.5 mg total) by mouth 2  (two) times daily.     omega-3 acid ethyl esters 1 G capsule  Commonly known as:  LOVAZA  Take 1 g by mouth 2 (two) times daily.     polyethylene glycol packet  Commonly known as:  MIRALAX / GLYCOLAX  Take 17 g by mouth daily. Hold if develops diarrhea     thyroid 60 MG tablet  Commonly known as:  ARMOUR  Take 60 mg by mouth daily.     zolpidem 10 MG tablet  Commonly known as:  AMBIEN  Take 5 mg by mouth at bedtime as needed for sleep.       Follow-up Information   Follow up with [facility], MD. Schedule an appointment as soon as possible for a visit in 1 week.   Contact information:   [address] [place] [address] [phone]       Follow up with Tereso Newcomer, PA-C On 11/30/2012. (F/U AT 340PM FOR AFIB, WITH CARDIOLOGY)    Contact information:   [address] [place] [address] [phone]       Follow up with Shirley Friar., MD. (F/U AS NEEDED)    Contact information:   [address], [address] [place]nie [place]rum[place] [address] [phone]        The results of significant diagnostics from this hospitalization (including imaging, microbiology, ancillary and laboratory) are listed below for reference.    Significant Diagnostic Studies: Dg Chest Port 1 View  11/22/2012  *RADIOLOGY REPORT*  Clinical Data: Short of breath  PORTABLE CHEST - 1 VIEW  Comparison: CT abdomen 10/12/2012  Findings: Normal mediastinum and cardiac silhouette.  Lungs are hyperinflated.  No effusion, infiltrate, or pneumothorax.  IMPRESSION:  Hyperinflated lungs.  No acute findings.   Original Report Authenticated By: Genevive Bi, M.D.     Microbiology: No results found for this or any previous visit (from the past 240 hour(s)).   Labs: Basic Metabolic Panel:  Recent Labs Lab 11/22/12 0930 11/23/12 0450 11/24/12 0422  NA 139 139 139  K 3.9 3.9 3.8  CL 99 106 106  CO2 21 18* 27  GLUCOSE 62* 64* 116*  BUN  23 13 10   CREATININE 0.75 0.63 0.61  CALCIUM 10.1 8.6 9.2   Liver Function Tests:  Recent Labs Lab 11/22/12 0930  AST 26  ALT 14  ALKPHOS 68  BILITOT 0.7  PROT 8.0  ALBUMIN 4.4   No results found for this basename: LIPASE, AMYLASE,  in the last 168 hours No results found for this basename: AMMONIA,  in the last 168 hours CBC:  Recent Labs Lab 11/22/12 0930 11/23/12 0450 11/24/12 0422  WBC 4.8 3.9* 4.6  NEUTROABS 3.9  --   --   HGB 14.6 11.0* 12.0  HCT 44.1 32.7* 34.2*  MCV 92.3 90.8 89.3  PLT 239 193 211  Cardiac Enzymes:  Recent Labs Lab 11/22/12 0930 11/22/12 1647 11/22/12 2250 11/23/12 0450  TROPONINI <0.30 <0.30 <0.30 <0.30   BNP: BNP (last 3 results)  Recent Labs  11/22/12 0930  PROBNP 64.8   CBG: No results found for this basename: GLUCAP,  in the last 168 hours     Signed:  THOMPSON,DANIEL  [facility] 11/24/2012, 9:30 AM

## 2012-11-24 NOTE — Progress Notes (Signed)
Came in at midnight. Took over patient care. Agree with last RN's shift assessment. Pt resting comfortably in bed, no complaints or signs of distress. Will continue to monitor patient. - Christell Faith ,RN

## 2012-11-30 ENCOUNTER — Ambulatory Visit (INDEPENDENT_AMBULATORY_CARE_PROVIDER_SITE_OTHER): Payer: Medicare Other | Admitting: Physician Assistant

## 2012-11-30 ENCOUNTER — Encounter: Payer: Self-pay | Admitting: Physician Assistant

## 2012-11-30 VITALS — BP 88/52 | HR 70 | Ht 65.0 in | Wt 113.8 lb

## 2012-11-30 DIAGNOSIS — E039 Hypothyroidism, unspecified: Secondary | ICD-10-CM

## 2012-11-30 DIAGNOSIS — I4891 Unspecified atrial fibrillation: Secondary | ICD-10-CM

## 2012-11-30 DIAGNOSIS — K922 Gastrointestinal hemorrhage, unspecified: Secondary | ICD-10-CM

## 2012-11-30 MED ORDER — METOPROLOL TARTRATE 12.5 MG HALF TABLET: 12.5000 mg | ORAL_TABLET | Freq: Every day | ORAL | Status: DC

## 2012-11-30 NOTE — Progress Notes (Signed)
[address], [address] [place], [place]  [address] Phone: [phone] Fax:  [phone]  Date:  11/30/2012   ID:  Mackenzie [name], DOB [birth year], MRN [id number]  PCP:  Mackenzie Adolph, MD  Primary Cardiologist/Electrophysiologist:  Dr. Sherryl [name]    History of Present Illness: Mackenzie [name] is a 67 y.o. female who returns for follow up after a recent hospital admission for AFib in the setting of recent LGI bleed from ischemic colitis.  She saw Dr. Graciela [name] in 1/12 with palpitations. ETT-echocardiogram at that time was normal. She recently had a wrist fracture and became constipated with pain medications. She took a laxative and then eventually had bloody diarrhea. CT scan was suggestive of ischemic colitis. She was treated with Cipro and Flagyl. She was to be set up for an outpatient colonoscopy. Prior to this, she developed lightheadedness and palpitations. She presented to the emergency room and was noted to be in atrial fibrillation with RVR. She was placed on Cardizem and converted back to NSR. She eventually had her colonoscopy which was unremarkable except for diverticulosis and hemorrhoids. She was discharged on low-dose metoprolol and aspirin and asked to follow up today. Since discharge, she has felt well. She denies dizziness, near syncope, lightheadedness. She denies syncope. She denies chest pain or shortness of breath. She denies orthopnea, PND or edema.  Echo 11/22/12: EF 60-65%, normal wall motion, trivial AI, trivial MR, PASP 23.  Labs (1/14):  Hgb 10.4 Labs (3/14):  Hgb 12, K 3.8, creatinine 0.61, ALT 14, BNP 64.8, TSH 0.376  Wt Readings from Last 3 Encounters:  11/30/12 113 lb 12.8 oz (51.619 kg)  11/22/12 111 lb 14.4 oz (50.758 kg)  11/22/12 111 lb 14.4 oz (50.758 kg)     Past Medical History  Diagnosis Date  . Arthritis   . Thyroid disease   . Anxiety   . Osteoporosis     per patient questionable  . Hashimoto's thyroiditis 10/12/2012  . Atrial fibrillation      Current Outpatient Prescriptions  Medication Sig Dispense Refill  . aspirin 81 MG chewable tablet Chew 1 tablet (81 mg total) by mouth daily.      . cholecalciferol (VITAMIN D) 1000 UNITS tablet Take 1,000 Units by mouth daily.      . fish oil-omega-3 fatty acids 1000 MG capsule Take 2 capsules by mouth daily.      . metoprolol tartrate (LOPRESSOR) 12.5 mg TABS Take 0.5 tablets (12.5 mg total) by mouth at bedtime.      . polyethylene glycol (MIRALAX / GLYCOLAX) packet Take 17 g by mouth daily. Hold if develops diarrhea  14 each  0  . thyroid (ARMOUR) 60 MG tablet Take 60 mg by mouth daily.      Marland Kitchen zolpidem (AMBIEN) 10 MG tablet Take 5 mg by mouth at bedtime as needed for sleep.       No current facility-administered medications for this visit.    Allergies:    Allergies  Allergen Reactions  . Clindamycin Hives  . Vancomycin Hcl Other (See Comments)    Not sure which medication gave her whelps but this was administered with the clindamycin     Social History:  The patient  reports that she has never smoked. She has never used smokeless tobacco. She reports that she does not drink alcohol or use illicit drugs.   ROS:  Please see the history of present illness.      All other systems reviewed and negative.   PHYSICAL EXAM:  VS:  BP 88/52  Pulse 70  Ht 5\' 5"  (1.651 m)  Wt 113 lb 12.8 oz (51.619 kg)  BMI 18.94 kg/m2 Well nourished, well developed, in no acute distress HEENT: normal Neck: no JVD Cardiac:  normal S1, S2; RRR; no murmur Lungs:  clear to auscultation bilaterally, no wheezing, rhonchi or rales Abd: soft, nontender, no hepatomegaly Ext: no edema Skin: warm and dry Neuro:  CNs 2-12 intact, no focal abnormalities noted  EKG:  NSR, HR 70, normal axis, no acute changes     ASSESSMENT AND PLAN:  1. Atrial Fibrillation:  This occurred in the setting of recent lower GI bleed from ischemic colitis as well as prep for colonoscopy.  CHADS2=0.  CHASDS2-VASc=2.  BP low with  metoprolol.  Will decrease to once daily.  Will place her on an event monitor to assess for recurrent AFib.  2. Lower GI Bleed:  Due to ischemic colitis.  Resolved.  Likely does not prohibit her from taking anticoagulation should this become necessary.   3. Hypothyroidism:  Recent TSH ok. 4. Disposition:  Follow up with Dr. Sherryl [name] in 4-6 weeks.   Mackenzie Glasgow, PA-C  5:38 PM 11/30/2012

## 2012-11-30 NOTE — Patient Instructions (Addendum)
PLEASE FOLLOW UP WITH DR. Graciela Husbands IN THE NEXT 4-6 WEEKS  DECREASE METOPROLOL TO 12.5 MG ONLY IN THE EVENING  Your physician has recommended that you wear an event monitor DX 427.31. Event monitors are medical devices that record the heart's electrical activity. Doctors most often [place] these monitors to diagnose arrhythmias. Arrhythmias are problems with the speed or rhythm of the heartbeat. The monitor is a small, portable device. You can wear one while you do your normal daily activities. This is usually used to diagnose what is causing palpitations/syncope (passing out).

## 2012-12-08 ENCOUNTER — Ambulatory Visit: Payer: Medicare Other | Admitting: Family Medicine

## 2012-12-08 ENCOUNTER — Ambulatory Visit (INDEPENDENT_AMBULATORY_CARE_PROVIDER_SITE_OTHER): Payer: Medicare Other

## 2012-12-08 DIAGNOSIS — I4891 Unspecified atrial fibrillation: Secondary | ICD-10-CM

## 2012-12-09 ENCOUNTER — Encounter: Payer: Self-pay | Admitting: Family Medicine

## 2012-12-09 ENCOUNTER — Ambulatory Visit (INDEPENDENT_AMBULATORY_CARE_PROVIDER_SITE_OTHER): Payer: Medicare Other | Admitting: Family Medicine

## 2012-12-09 VITALS — BP 106/56 | HR 85 | Temp 98.1°F | Resp 16 | Ht 65.0 in | Wt 113.8 lb

## 2012-12-09 DIAGNOSIS — L609 Nail disorder, unspecified: Secondary | ICD-10-CM

## 2012-12-09 DIAGNOSIS — K5900 Constipation, unspecified: Secondary | ICD-10-CM

## 2012-12-09 NOTE — Progress Notes (Signed)
Placed a event monitor and went over instructions on how to use it and when to return it 

## 2012-12-14 ENCOUNTER — Encounter: Payer: Self-pay | Admitting: Family Medicine

## 2012-12-14 NOTE — Progress Notes (Signed)
S: This 67 y.o. Cauc female was recently hospitalized with GI complications related to colonoscopy prep. She developed cardiac arrythmia which is being evaluated by cardiology; eventually, she improved and was able to undergo the GI procedure. She c/o mild intermittent constipation treated w/ MiraLax. Her appetite has recovered such that she has regained some of the weight lost during hospitalization. She reports that her thyroid medication dose was twice what she should have been taking. Her correct dose of Armour Thyroid is 60 mg; she had reported her dose as 120 mg. She and her endocrinologist speculate that this may be the cause of the arhythmia  (atrial fibrillation).   Pt c/o abnormal toenail (due to trauma); nail has come off several times but grows back thick and discolored. Toe is mildly painful.  ROS: As per HPI. Negative for fever, diaphoresis, fatigue, CP or tightness, palpitations, SOb or cough, myalgias, HA, dizziness, weakness, tremor or syncope.  O:  Filed Vitals:   12/09/12 1032  BP: 106/56  Pulse: 85  Temp: 98.1 F (36.7 C)  Resp: 16   GEN: In NAD; WN,WD. HENT: [place]/AT; EOMI w/ clear conj/ sclerae. Oroph clear and moist. COR: RRR. LUNGS: Normal resp rate and effort. SKIN: W&D; no erythema or pallor. NEURO: A&O x 3; CNs intact; Nonfocal.  A/P: Unspecified constipation- advise nutrition modifications and continue MiraLax. Contact GI if problems persist.  Nail abnormality - Plan: Ambulatory referral to Podiatry

## 2012-12-15 ENCOUNTER — Telehealth: Payer: Self-pay | Admitting: Internal Medicine

## 2012-12-15 ENCOUNTER — Telehealth: Payer: Self-pay

## 2012-12-15 ENCOUNTER — Other Ambulatory Visit: Payer: Self-pay | Admitting: Family Medicine

## 2012-12-15 MED ORDER — CLONAZEPAM 0.25 MG PO TBDP: 0.2500 mg | ORAL_TABLET | Freq: Two times a day (BID) | ORAL | Status: DC | PRN

## 2012-12-15 NOTE — Telephone Encounter (Signed)
New problem   [employer] called pt and informed her that her monitor had not been recording-not sure how long, but pt was told to call our office

## 2012-12-15 NOTE — Telephone Encounter (Signed)
Patient advised, she wants you to know how grateful she is you have done this for her, she will call if it does not help. She states you take good care of her and she is thankful. To you FYI

## 2012-12-15 NOTE — Telephone Encounter (Signed)
PT STATES SHE IS HAVING A PROBLEM AND WOULD LIKE TO DISCUSS WITH SOMEONE. SHE IS DR MCPHERSON'S PT PLEASE CALL [phone]

## 2012-12-15 NOTE — Telephone Encounter (Signed)
I called in medication to pt's pharmacy; Clonazepam can be taken twice a day as needed.

## 2012-12-15 NOTE — Telephone Encounter (Signed)
Spoke to patient, she was here last week. Patient has had hand surgery took pain meds, got constipated, took colonoscopy prep, back to hospital. Now on heart monitor for Afib, just found out it was not recording, very upset/ anxious. Wants to know if she can have something prescribed, feels anxious, getting depressed. Does not feel like she is going to harm herself or anyone else, states she is at her "wits end" please advise.

## 2013-01-03 ENCOUNTER — Ambulatory Visit (INDEPENDENT_AMBULATORY_CARE_PROVIDER_SITE_OTHER): Payer: Medicare Other | Admitting: Family Medicine

## 2013-01-03 ENCOUNTER — Ambulatory Visit: Payer: Medicare Other

## 2013-01-03 VITALS — BP 120/69 | HR 66 | Temp 98.0°F | Resp 18

## 2013-01-03 DIAGNOSIS — M25532 Pain in left wrist: Secondary | ICD-10-CM

## 2013-01-03 DIAGNOSIS — M25539 Pain in unspecified wrist: Secondary | ICD-10-CM

## 2013-01-03 DIAGNOSIS — R55 Syncope and collapse: Secondary | ICD-10-CM

## 2013-01-03 MED ORDER — HYDROCODONE-ACETAMINOPHEN 5-325 MG PO TABS: 1.0000 | ORAL_TABLET | Freq: Four times a day (QID) | ORAL | Status: DC | PRN

## 2013-01-03 NOTE — Progress Notes (Signed)
   [address], [place] [address]   Phone [phone]  Subjective:    Patient ID: Mackenzie [name], female    DOB: 04-Jun-1946, 67 y.o.   MRN: [id number]  HPI Pt presents to clinic with L wrist pain after a fall this am when she tripped over the curb and landed on her outstretched hand and then hit her L chin on the side walk.  She did well all day but then the pain started to get really bad - she took a left over Ultram and it did not help the pain.  When she arrived here she felt nauseated and felt like she was going to pass out - she was brought back immediately and I was brought immediately into the room to assess her low BP.    Review of Systems  Musculoskeletal: Positive for arthralgias.       Objective:   Physical Exam  Vitals reviewed. Constitutional: She appears well-developed and well-nourished.  HENT:  Head: Normocephalic and atraumatic.    Right Ear: External ear normal.  Left Ear: External ear normal.  Pulmonary/Chest: Effort normal.  Musculoskeletal:       Left wrist: She exhibits decreased range of motion (pt does not move her wrist without a grimace), tenderness (wrist ) and bony tenderness (wrist). She exhibits no swelling.  Skin: Skin is warm and dry.  Psychiatric: She has a normal mood and affect. Her behavior is normal. Judgment and thought content normal.   Pt pain seems more than her exam and xray show.  After my emergent assessment of her BP - with a cold cloth placed on her forehead and she started to feel better.  Her BP was rechecked and it was much better.  Her pulse was normal.  [facility] reading (PRIMARY) by  Dr. Clelia Croft. Neg.  Pt placed in sugar tong and pt experienced extreme pain esp with thumb movement.     Assessment & Plan:  Wrist pain, acute, left - Plan: DG Wrist Complete Left, HYDROcodone-acetaminophen (NORCO/VICODIN) 5-325 MG per tablet -- though no fracture on xray pt has extreme pain with exam so we will treat her as a fracture with a sugar tong  and she will RTC in 7-10 days if she is still experiencing pain we will re-xray and then probably refer unless a fracture is seen at that time.  Vasovagal - pt feels much better after she rests - and BP returns to normal for her.  Pt was seen and also evaluated by Dr Clelia Croft.  Benny Lennert PA-C 01/03/2013 8:23 PM

## 2013-01-04 NOTE — Progress Notes (Signed)
Pt assessed, reviewed documentation including xray and agree w/ assessment and plan. Norberto Sorenson, MD MPH

## 2013-01-27 ENCOUNTER — Encounter: Payer: Self-pay | Admitting: Internal Medicine

## 2013-01-27 ENCOUNTER — Ambulatory Visit (INDEPENDENT_AMBULATORY_CARE_PROVIDER_SITE_OTHER): Payer: Medicare Other | Admitting: Internal Medicine

## 2013-01-27 VITALS — BP 95/60 | HR 75 | Ht 65.0 in | Wt 115.0 lb

## 2013-01-27 DIAGNOSIS — I4891 Unspecified atrial fibrillation: Secondary | ICD-10-CM

## 2013-01-27 NOTE — Patient Instructions (Addendum)
Your physician recommends that you schedule a follow-up appointment as needed  

## 2013-01-27 NOTE — Assessment & Plan Note (Signed)
The patient is here to review data. She is exceedingly frustrated about being late. She is also seen in the frustrated about issues related to her event recorder. She apparently had called the company to find out what it was working. She was told by the company that she needed to call the office. She said she called the office and never got a return phone call. She was frustrated as well as disappointed. We will see to address this.  From a clinical point of view she had atrial fibrillation in the context of her colonoscopy preparation.she has not had previous similar symptoms even with her history of palpitations. I think it is reasonable to watch and wait prior to any further therapy. In this regard I would stop her aspirin as well as stop her beta blocker and let recurrence declare  Itself    We also reviewed her thromboembolic risk.  She has a CHADS2 score of 0 and a CHADS-VASc score of 2.  She is 60 so her CHADS-VASc score is just now 2 and I think it is reasonable to hold off on anticoagulation into we see if there is clinical atrial fibrillation

## 2013-01-27 NOTE — Progress Notes (Signed)
Patient Care Team: Maurice March, MD as PCP - General (Family Medicine)   HPI  Mackenzie [name] is a 67 y.o. female  Seen in followup for atrial fibrillation. I apparently saw her in January 2012 for palpitations. Echocardiogram at that time was normal.  She was recently hospitalized for lower GI bleed attributed to  hemoorhoids developing in the context of constipation 2/2 pain meds Past Medical History  Diagnosis Date  . Arthritis   . Thyroid disease   . Anxiety   . Osteoporosis     per patient questionable  . Hashimoto's thyroiditis 10/12/2012  . Atrial fibrillation   . Depression     Past Surgical History  Procedure Laterality Date  . Cosmetic surgery    . Abdominal hysterectomy    . Bladder prolapse surgery     . Wrist surgery    . Colonoscopy N/A 11/23/2012    Procedure: COLONOSCOPY;  Surgeon: Vertell Novak., MD;  Location: [facility];  Service: Endoscopy;  Laterality: N/A;  . Joint replacement      Current Outpatient Prescriptions  Medication Sig Dispense Refill  . aspirin 81 MG chewable tablet Chew 1 tablet (81 mg total) by mouth daily.      . cholecalciferol (VITAMIN D) 1000 UNITS tablet Take 1,000 Units by mouth daily.      . fish oil-omega-3 fatty acids 1000 MG capsule Take 2 capsules by mouth daily.      . metoprolol tartrate (LOPRESSOR) 12.5 mg TABS Take 0.5 tablets (12.5 mg total) by mouth at bedtime.      . NON FORMULARY 4 (four) times a week.      . polyethylene glycol (MIRALAX / GLYCOLAX) packet Take 17 g by mouth daily. Hold if develops diarrhea  14 each  0  . thyroid (ARMOUR) 60 MG tablet Take 60 mg by mouth daily.      Marland Kitchen zolpidem (AMBIEN) 10 MG tablet Take 5 mg by mouth at bedtime as needed for sleep.       No current facility-administered medications for this visit.    Allergies  Allergen Reactions  . Clindamycin Hives  . Vancomycin Hcl Other (See Comments)    Not sure which medication gave her whelps but this was administered with the  clindamycin     Review of Systems negative except from HPI and PMH  Physical Exam BP 95/60  Pulse 75  Ht 5\' 5"  (1.651 m)  Wt 115 lb (52.164 kg)  BMI 19.14 kg/m2  SpO2 98%    Event recorder isolated PVC and PAC   Assessment and  Plan

## 2013-02-24 ENCOUNTER — Other Ambulatory Visit: Payer: Self-pay

## 2013-02-24 DIAGNOSIS — Z1231 Encounter for screening mammogram for malignant neoplasm of breast: Secondary | ICD-10-CM

## 2013-03-02 ENCOUNTER — Encounter: Payer: Medicare Other | Admitting: Family Medicine

## 2013-03-15 ENCOUNTER — Telehealth: Payer: Self-pay

## 2013-03-15 NOTE — Telephone Encounter (Signed)
PT STATES SHE DOESN'T HAVE AN APPT WITH DR MCPHERSON FOR ANOTHER MONTH, BUT WOULD LIKE TO KNOW IF SHE WOULD PRESCRIBED HER SOMETHING FOR ANXIETY UNTIL SHE IS SEEN. PLEASE CALL [phone]   [facility]

## 2013-03-16 ENCOUNTER — Telehealth: Payer: Self-pay

## 2013-03-16 MED ORDER — ALPRAZOLAM 0.25 MG PO TABS: ORAL_TABLET | ORAL | Status: DC

## 2013-03-16 NOTE — Telephone Encounter (Signed)
Called and left a message that I will call in medication (Alprazolam) to pt's pharmacy.

## 2013-03-16 NOTE — Telephone Encounter (Signed)
Please notify pt that I called a medication to her pharmacy. It should be ready for pick-up later today.

## 2013-03-16 NOTE — Telephone Encounter (Signed)
Patient advised.

## 2013-03-16 NOTE — Telephone Encounter (Signed)
Pt has tried to get an appt with dr Audria Nine, but since mcr pt, the first available is 04/13/13, in which she is scheduled. However pt states she is having anxiety and is requesting something to be called in for that now because pt states cant wait until appt Pt uses [facility] on [address]

## 2013-03-30 ENCOUNTER — Ambulatory Visit
Admission: RE | Admit: 2013-03-30 | Discharge: 2013-03-30 | Disposition: A | Discharge status: Home / Self Care | Payer: Medicare Other | Source: Ambulatory Visit

## 2013-03-30 DIAGNOSIS — Z1231 Encounter for screening mammogram for malignant neoplasm of breast: Secondary | ICD-10-CM

## 2013-04-12 ENCOUNTER — Ambulatory Visit (INDEPENDENT_AMBULATORY_CARE_PROVIDER_SITE_OTHER): Payer: Medicare Other | Admitting: Family Medicine

## 2013-04-12 ENCOUNTER — Encounter: Payer: Self-pay | Admitting: Family Medicine

## 2013-04-12 VITALS — BP 92/48 | HR 85 | Temp 99.1°F | Resp 16 | Ht 65.0 in | Wt 114.2 lb

## 2013-04-12 DIAGNOSIS — G4701 Insomnia due to medical condition: Secondary | ICD-10-CM

## 2013-04-12 DIAGNOSIS — F411 Generalized anxiety disorder: Secondary | ICD-10-CM

## 2013-04-12 MED ORDER — ZOLPIDEM TARTRATE 10 MG PO TABS: 5.0000 mg | ORAL_TABLET | Freq: Every evening | ORAL | Status: DC | PRN

## 2013-04-12 MED ORDER — ESCITALOPRAM OXALATE 10 MG PO TABS: ORAL_TABLET | ORAL | Status: DC

## 2013-04-12 MED ORDER — ALPRAZOLAM 0.25 MG PO TABS: ORAL_TABLET | ORAL | Status: DC

## 2013-04-12 NOTE — Patient Instructions (Signed)

## 2013-04-13 NOTE — Progress Notes (Signed)
S:  This 67 y.o. Cauc female has chronic moderately severe anxiety and is interested in daily medication; Alprazolam prn has been helpful in addition to Zolpidem for sleep. Pt has tried SSRIs in the past (Sertraline, Fluoxetine) but did not tolerate them well (mental fogginess, etc). Her family hx suggests that significant mental health issues; mother had significant anxiety and possible depression that she treated with alcohol and tobacco use. Pt's daughter is taking Lexapro and Restoril. Her brother is an alcoholic whose disease escalated after the death of his wife 10 years ago. Pt's two grandsons have severe anxiety/ behavior issues and OCD. Some of pt's symptoms are exacerbated by husband who is not in touch emotionally; she states that his response to serious issues/ discussions is "to joke about things"; pt finds this frustrating. Other personal issues (recent GI problems) have been causing more anxiety lately and she is ready to try another daily anxiolytic.  PMHx, Soc Hx and Fam Hx reviewed.  ROS: As per HPI; pt endorses sleep disturbance but no idea of self harm, SI/HI. She denies confusion or emotional lability, dysphoric mood or concentration problems. She has had "near- panic attacks" lately.  O: Filed Vitals:   04/12/13 1110  BP: 92/48  Pulse: 85  Temp: 99.1 F (37.3 C)  Resp: 16   GEN: In NAD; WN,WD. HENT: [place]/AT; EOMI w/ clear conj/sclerae. EACs/nose/ oroph unremarkable. COR: RRR; no edema. LUNGS: Normal resp rate and effort. SKIN: W&D; no rashes, erythema or pallor. NEURO: A&O x 3; CNs intact. Nonfocal. PSYCH: Pleasant calm demeanor; good eye contact. Anxious but not labile or angry. Speech pattern and thought content are normal. Cognition is normal Judgement is sound.   A/P: Generalized anxiety disorder- Pt si willing to try Lexapro as her daughter takes it and it is helpful; due to pt's apprehension, will start at low dose of 5 mg daily.  Pt will remain at this dose for  2-4 weeks if she feels better; if only slightly improved, then increase to 1 tablet= 10 mg daily. Call office w/ questions or concerns.   Insomnia due to medical condition- Pt will alternate Zolpidem with Alprazolam as needed for sleep.  Meds ordered this encounter  Medications  . escitalopram (LEXAPRO) 10 MG tablet    Sig: Take 1/2 tablet every morning for 2 weeks then increase to 1 tablet every morning.    Dispense:  30 tablet    Refill:  5  . zolpidem (AMBIEN) 10 MG tablet    Sig: Take 0.5 tablets (5 mg total) by mouth at bedtime as needed for sleep.    Dispense:  30 tablet    Refill:  1  . ALPRAZolam (XANAX) 0.25 MG tablet    Sig: Take 1 tablet at bedtime every other night as needed for sleep.    Dispense:  30 tablet    Refill:  0   Face-to-face time with pt= 45 minutes.

## 2013-05-16 ENCOUNTER — Other Ambulatory Visit: Payer: Self-pay | Admitting: Family Medicine

## 2013-05-16 NOTE — Telephone Encounter (Signed)
Pt called and stated that she left her Zolpidem 10 mg RX at the pharmacy back in July for them to hold for her until she needed to get it filled. She dropped it off right after her appointment with dr. Audria Nine and had the other two medications filled. She went back to the pharmacy and they are saying that they dont have the Zolpidem rx. She is certain that she left it there and is in need of a refill. Please call  [phone]

## 2013-05-16 NOTE — Telephone Encounter (Signed)
Zolpidem refill phoned to pt's pharmacy. 

## 2013-05-31 ENCOUNTER — Encounter: Payer: Self-pay | Admitting: Family Medicine

## 2013-05-31 ENCOUNTER — Ambulatory Visit (INDEPENDENT_AMBULATORY_CARE_PROVIDER_SITE_OTHER): Payer: Medicare Other | Admitting: Family Medicine

## 2013-05-31 VITALS — BP 102/52 | HR 73 | Temp 98.1°F | Resp 16 | Ht 65.0 in | Wt 116.0 lb

## 2013-05-31 DIAGNOSIS — G4700 Insomnia, unspecified: Secondary | ICD-10-CM

## 2013-05-31 DIAGNOSIS — F329 Major depressive disorder, single episode, unspecified: Secondary | ICD-10-CM

## 2013-05-31 DIAGNOSIS — F341 Dysthymic disorder: Secondary | ICD-10-CM

## 2013-05-31 DIAGNOSIS — Z23 Encounter for immunization: Secondary | ICD-10-CM

## 2013-05-31 MED ORDER — ALPRAZOLAM 0.25 MG PO TABS: ORAL_TABLET | ORAL | Status: DC

## 2013-06-02 NOTE — Progress Notes (Signed)
S:  This 67 y.o. Cauc female has chronic anxiety and sleep disorder. Pt is concerned about chronic use of medications so she takes less than full dose prescribed in addition to alternating OTC sleep aids (Melatonin) w/ prescription meds. This works well for her. She has no expectation of ever having normal sleep hygiene. She and her husband have addressed all the environmental factors that interrupt sleep. Current medication for chronic anxiety is effective and pt does not want to increase escitalopram.   Patient Active Problem List   Diagnosis Date Noted  . Atrial fibrillation 11/22/2012  . Hypokalemia 10/15/2012  . Ischemic colitis 10/13/2012  . Acute blood loss anemia 10/13/2012  . Diverticulosis 10/12/2012  . Constipation 10/12/2012  . Hashimoto's thyroiditis 10/12/2012  . Insomnia 03/02/2012  . Anxiety and depression 03/02/2012  . Elevated LDL cholesterol level 03/02/2012  . Palpitations 10/07/2010  . DYSPNEA ON EXERTION 10/07/2010   PMHx, Soc Hx and Fam Hx reviewed. Medications reconciled.  ROS: As per HPI; negative for fatigue, anorexia, abnormal weight loss, diaphoresis, vision disturbances, CP or tightness, palpitations, SOB or DOE, GI problems, rashes or abnormal skin changes, HA, dizziness, tremor, weakness, numbness, paresthesias, panic attacks, behavior disturbances, dysphoric mood or thoughts of self harm.   O: Filed Vitals:   05/31/13 1104  BP: 102/52  Pulse: 73  Temp: 98.1 F (36.7 C)  Resp: 16   GEN: in NAD; WN,WD. HENT: [place]/AT; EOMI w/ clear conj/sclerae. EACs/nose/oroph unremarkable. NECK: No LAN or TMG. COR: RRR. No edema. LUNGS: Normal resp rate and effort. SKIN: W&D; intact w/o rashes, erythema or ecchymoses. No pallor. NEURO: A&O x 3; CNs intact. Nonfocal. PSYCH: Pleasant and calm, conversant.    A/P: Insomnia- Continue current sleep regimen. Encouraged regular exercise.  Anxiety and depression- Pt in a good place as far a dealing w/ anxiety and fears  about the world around her. She is determined to enjoy her current situation and not worry about the future so much.  Need for prophylactic vaccination and inoculation against influenza - Plan: Flu Vaccine QUAD 36+ mos IM  Meds ordered this encounter  Medications  . ALPRAZolam (XANAX) 0.25 MG tablet    Sig: Take 1 tablet at bedtime every other night as needed for sleep.    Dispense:  30 tablet    Refill:  1

## 2013-06-15 ENCOUNTER — Encounter: Payer: Medicare Other | Admitting: Family Medicine

## 2013-08-17 ENCOUNTER — Encounter: Payer: Medicare Other | Admitting: Family Medicine

## 2013-10-03 ENCOUNTER — Other Ambulatory Visit: Payer: Self-pay | Admitting: Family Medicine

## 2013-10-03 NOTE — Telephone Encounter (Signed)
Zolpidem 10 mg  #30 tablets w/ 1 refill phoned to pt's pharmacy.

## 2013-10-16 ENCOUNTER — Other Ambulatory Visit: Payer: Self-pay | Admitting: Family Medicine

## 2013-11-06 IMAGING — CR DG KNEE COMPLETE 4+V*R*
4 series · 4 of 4 positions shown · non-contrast
Comparison: None.

CLINICAL DATA: Severe knee pain

RIGHT KNEE - COMPLETE 4+ VIEW

[AP]
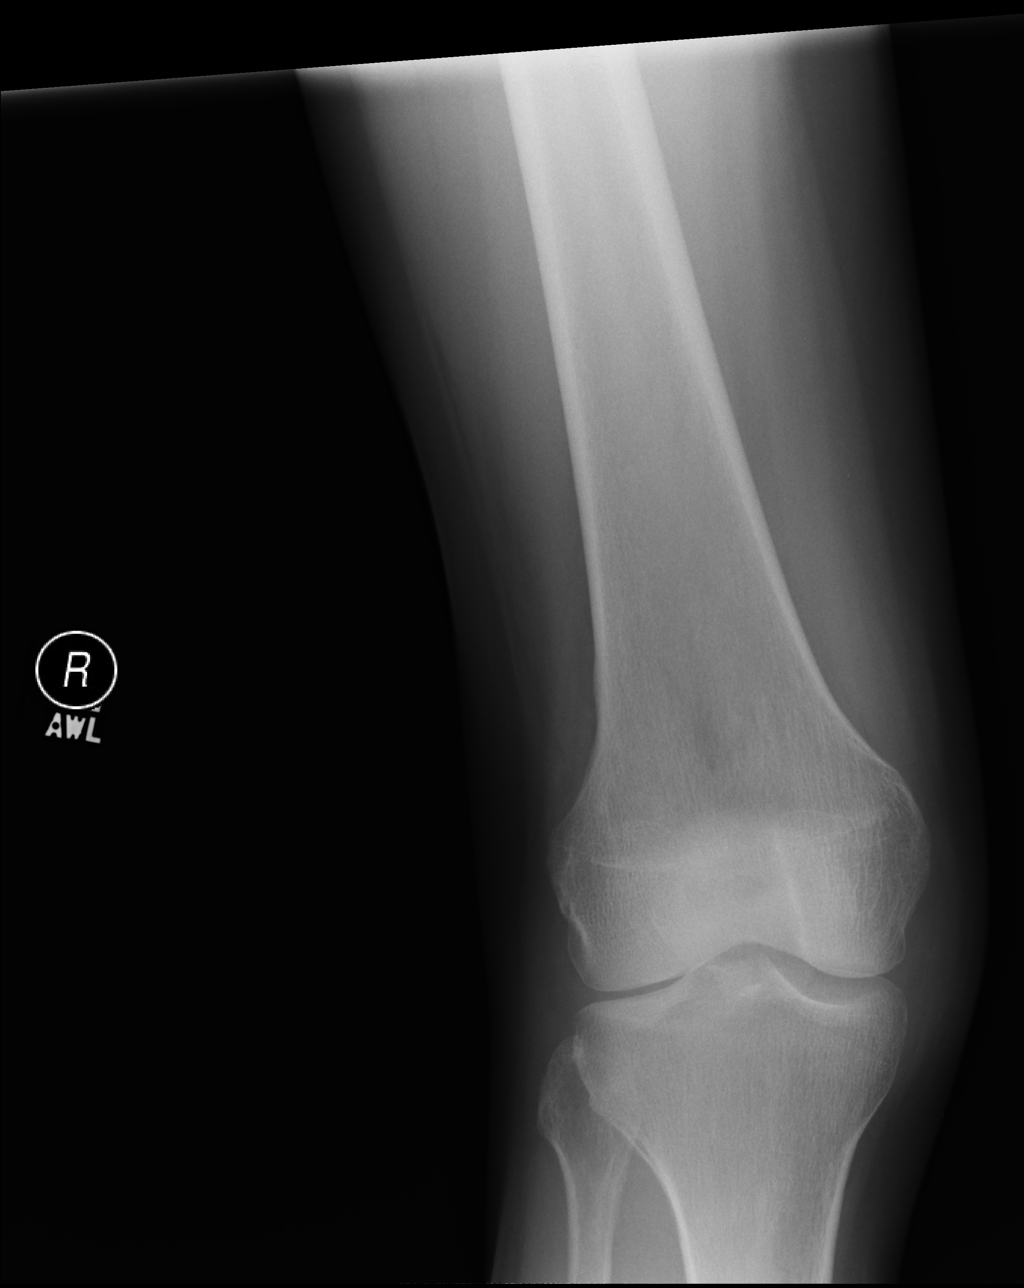

[lateral]
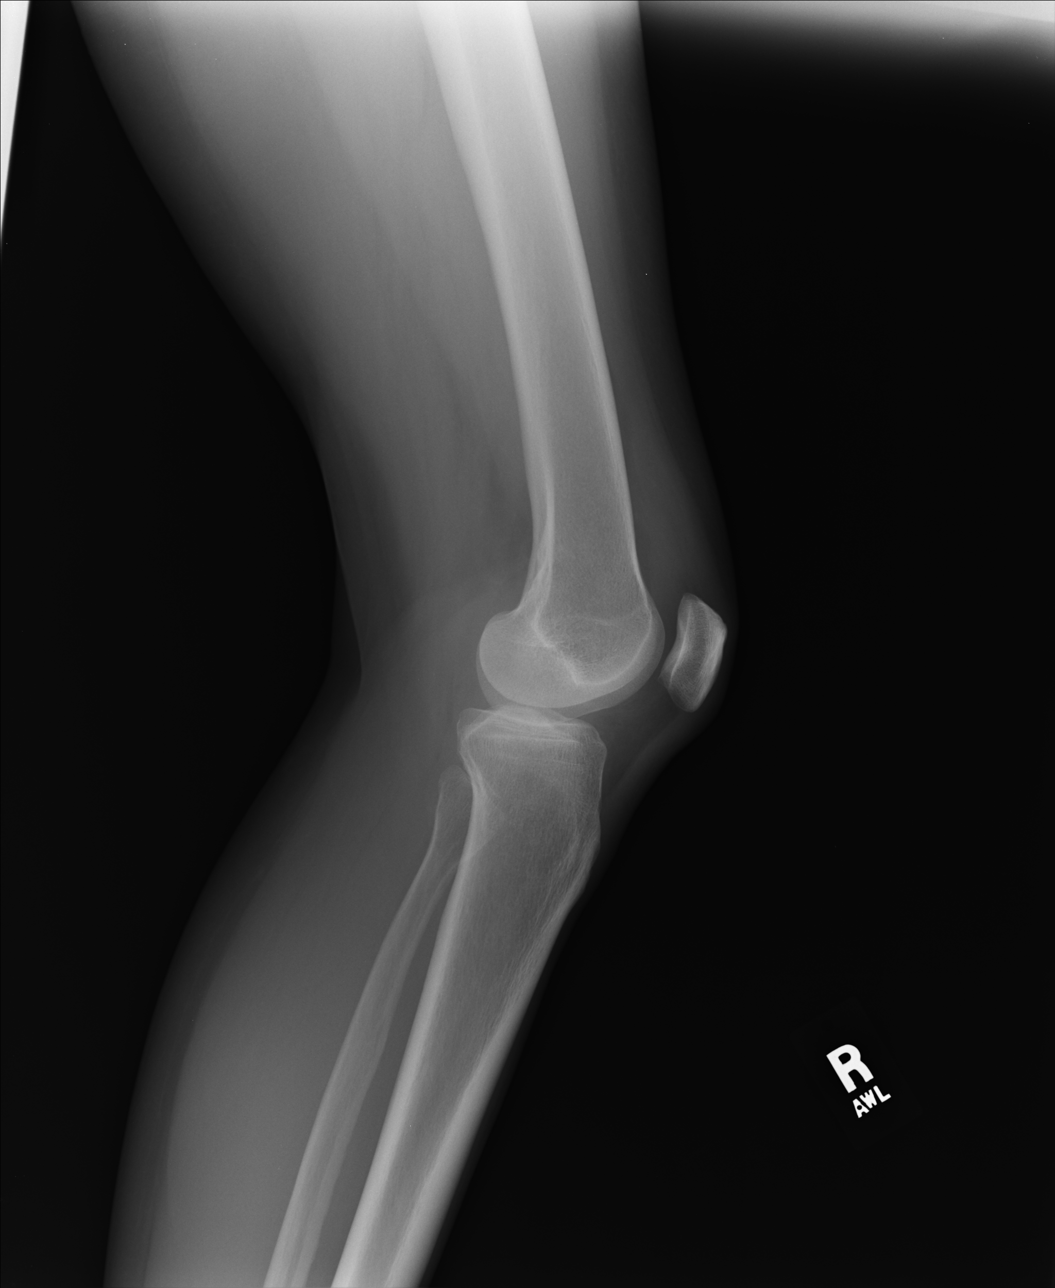

[ap axial]
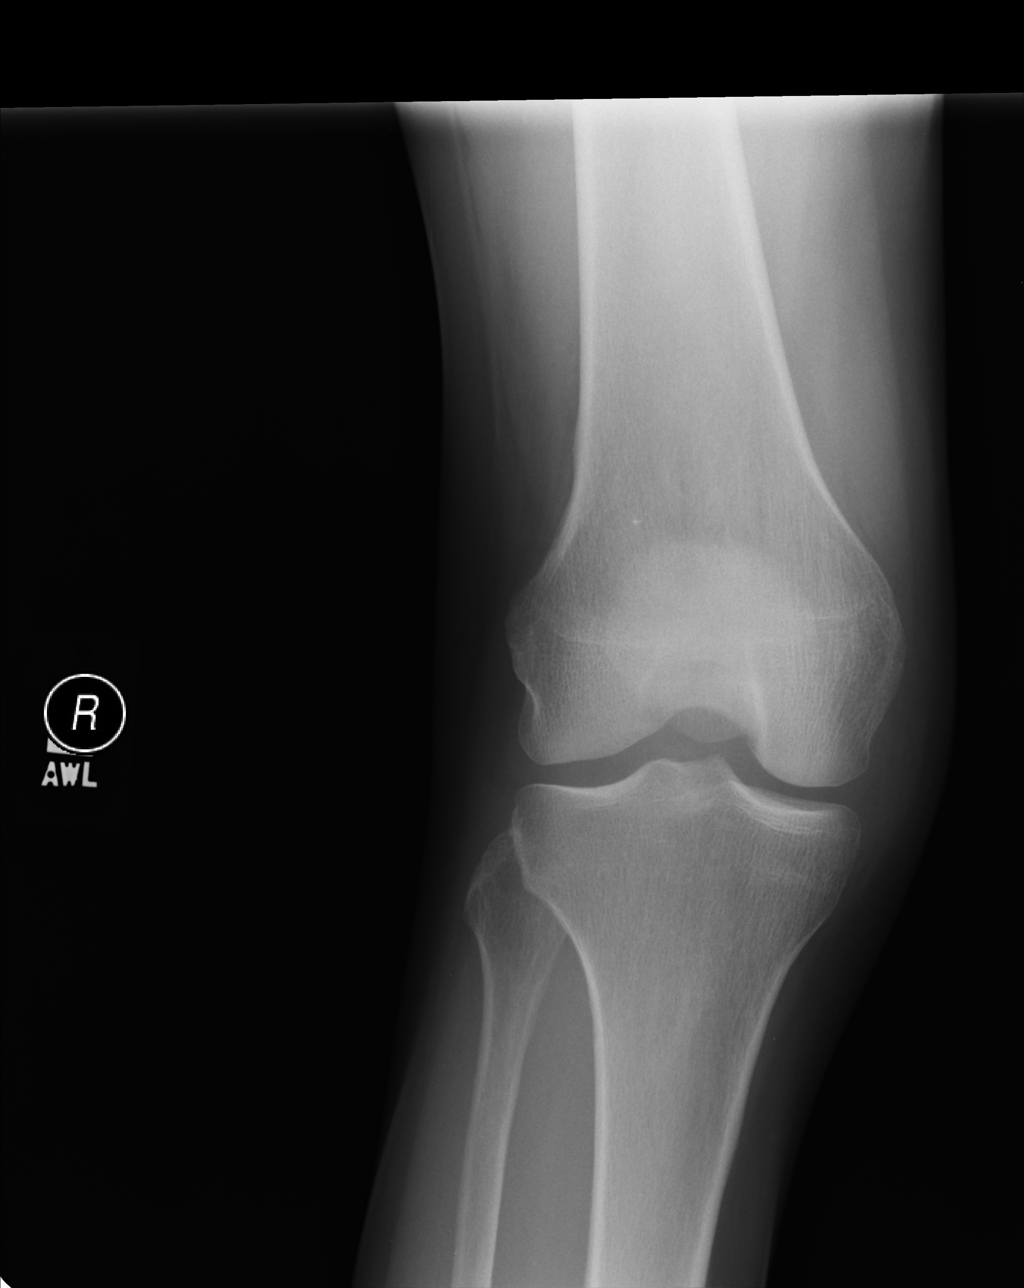

[sunrise]
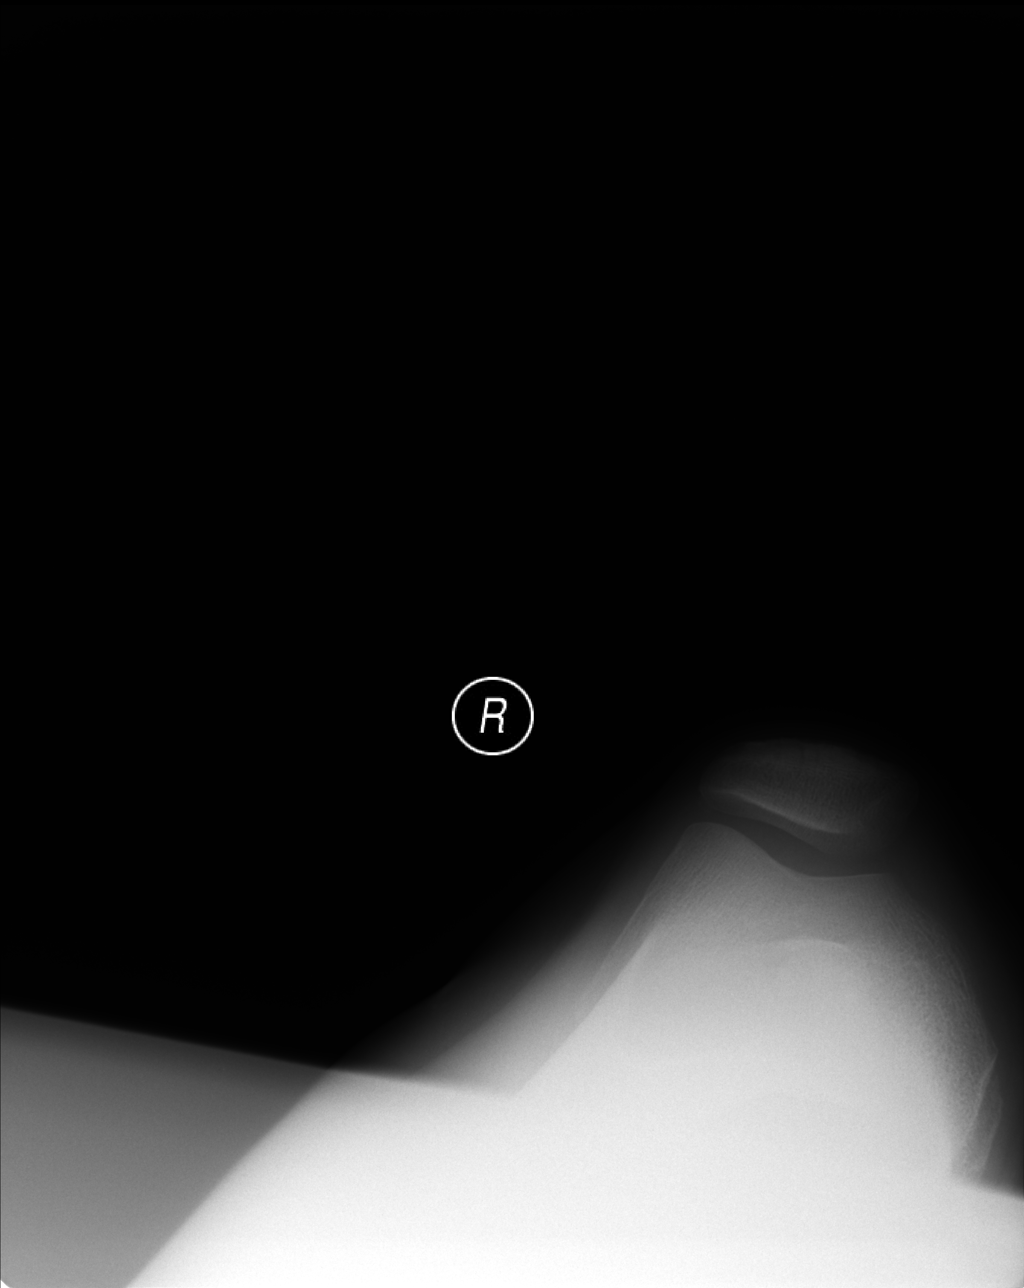

[4 of 4 positions shown; findings below may reference images not displayed]

FINDINGS: Moderate joint effusion.  No acute fracture and no
dislocation.  Joint spaces maintained.
IMPRESSION: No acute bony pathology.

Clinically significant discrepancy from primary report, if
provided: Joint effusion

## 2013-11-13 ENCOUNTER — Other Ambulatory Visit: Payer: Self-pay | Admitting: Family Medicine

## 2013-11-14 NOTE — Telephone Encounter (Signed)
Alprazolam refill phoned to pt's pharmacy. 

## 2013-12-14 ENCOUNTER — Other Ambulatory Visit: Payer: Self-pay | Admitting: Family Medicine

## 2014-01-15 ENCOUNTER — Other Ambulatory Visit: Payer: Self-pay | Admitting: Physician Assistant

## 2014-02-01 ENCOUNTER — Other Ambulatory Visit: Payer: Self-pay | Admitting: Family Medicine

## 2014-02-02 NOTE — Telephone Encounter (Signed)
Zolpidem refill phoned to pt's pharmacy. 

## 2014-02-02 NOTE — Telephone Encounter (Signed)
Pt has appt sch 02/14/14

## 2014-02-14 ENCOUNTER — Ambulatory Visit (INDEPENDENT_AMBULATORY_CARE_PROVIDER_SITE_OTHER): Payer: Medicare Other | Admitting: Family Medicine

## 2014-02-14 ENCOUNTER — Encounter: Payer: Self-pay | Admitting: Family Medicine

## 2014-02-14 VITALS — BP 110/62 | HR 70 | Temp 98.8°F | Resp 16 | Ht 65.0 in | Wt 122.0 lb

## 2014-02-14 DIAGNOSIS — R319 Hematuria, unspecified: Secondary | ICD-10-CM

## 2014-02-14 DIAGNOSIS — E7800 Pure hypercholesterolemia, unspecified: Secondary | ICD-10-CM

## 2014-02-14 DIAGNOSIS — Z1159 Encounter for screening for other viral diseases: Secondary | ICD-10-CM

## 2014-02-14 DIAGNOSIS — Z0000 Encounter for general adult medical examination without abnormal findings: Secondary | ICD-10-CM

## 2014-02-14 DIAGNOSIS — D649 Anemia, unspecified: Secondary | ICD-10-CM

## 2014-02-14 DIAGNOSIS — L989 Disorder of the skin and subcutaneous tissue, unspecified: Secondary | ICD-10-CM

## 2014-02-14 DIAGNOSIS — E2839 Other primary ovarian failure: Secondary | ICD-10-CM

## 2014-02-14 LAB — CBC WITH DIFFERENTIAL/PLATELET
BASOS PCT: 0 % (ref 0–1)
Basophils Absolute: 0 10*3/uL (ref 0.0–0.1)
Eosinophils Absolute: 0 10*3/uL (ref 0.0–0.7)
Eosinophils Relative: 1 % (ref 0–5)
HEMATOCRIT: 41.7 % (ref 36.0–46.0)
Hemoglobin: 13.9 g/dL (ref 12.0–15.0)
Lymphocytes Relative: 36 % (ref 12–46)
Lymphs Abs: 1.3 10*3/uL (ref 0.7–4.0)
MCH: 30.3 pg (ref 26.0–34.0)
MCHC: 33.3 g/dL (ref 30.0–36.0)
MCV: 90.8 fL (ref 78.0–100.0)
MONO ABS: 0.3 10*3/uL (ref 0.1–1.0)
Monocytes Relative: 8 % (ref 3–12)
NEUTROS ABS: 1.9 10*3/uL (ref 1.7–7.7)
NEUTROS PCT: 55 % (ref 43–77)
PLATELETS: 229 10*3/uL (ref 150–400)
RBC: 4.59 MIL/uL (ref 3.87–5.11)
RDW: 13.9 % (ref 11.5–15.5)
WBC: 3.5 10*3/uL — ABNORMAL LOW (ref 4.0–10.5)

## 2014-02-14 LAB — POCT URINALYSIS DIPSTICK
BILIRUBIN UA: NEGATIVE
GLUCOSE UA: NEGATIVE
Ketones, UA: NEGATIVE
Leukocytes, UA: NEGATIVE
NITRITE UA: NEGATIVE
Protein, UA: NEGATIVE
SPEC GRAV UA: 1.02
Urobilinogen, UA: 0.2
pH, UA: 6

## 2014-02-14 MED ORDER — ESCITALOPRAM OXALATE 10 MG PO TABS: 10.0000 mg | ORAL_TABLET | Freq: Every day | ORAL | Status: DC

## 2014-02-14 MED ORDER — ZOSTER VACCINE LIVE 19400 UNT/0.65ML ~~LOC~~ SOLR: 0.6500 mL | Freq: Once | SUBCUTANEOUS | Status: DC

## 2014-02-14 MED ORDER — ALPRAZOLAM 0.25 MG PO TABS: ORAL_TABLET | ORAL | Status: DC

## 2014-02-14 NOTE — Patient Instructions (Addendum)
Keeping You Healthy  Get These Tests  Blood Pressure- Have your blood pressure checked by your healthcare provider at least once a year.  Normal blood pressure is 120/80.  Weight- Have your body mass index (BMI) calculated to screen for obesity.  BMI is a measure of body fat based on height and weight.  You can calculate your own BMI at [employer]  Cholesterol- Have your cholesterol checked every year.  Diabetes- Have your blood sugar checked every year if you have high blood pressure, high cholesterol, a family history of diabetes or if you are overweight.  Pap Smear- Have a pap smear every 1 to 3 years if you have been sexually active.  If you are older than 65 and recent pap smears have been normal you may not need additional pap smears.  In addition, if you have had a hysterectomy  For benign disease additional pap smears are not necessary.  Mammogram-Yearly mammograms are essential for early detection of breast cancer. Due in July 2015.  Screening for Colon Cancer- Colonoscopy starting at age 31. Screening may begin sooner depending on your family history and other health conditions.  Follow up colonoscopy as directed by your Gastroenterologist.  Screening for Osteoporosis- Screening begins at age 29 with bone density scanning, sooner if you are at higher risk for developing Osteoporosis.  Get these medicines  Calcium with Vitamin D- Your body requires 1200-1500 mg of Calcium a day and [phone] IU of Vitamin D a day.  You can only absorb 500 mg of Calcium at a time therefore Calcium must be taken in 2 or 3 separate doses throughout the day.  Hormones- Hormone therapy has been associated with increased risk for certain cancers and heart disease.  Talk to your healthcare provider about if you need relief from menopausal symptoms.  Aspirin- Ask your healthcare provider about taking Aspirin to prevent Heart Disease and Stroke.  Get these Immuniztions  Flu shot- Every  fall  Pneumonia shot- Once after the age of 72; if you are younger ask your healthcare provider if you need a pneumonia shot.  Tetanus- Every ten years.  Zostavax- Once after the age of 22 to prevent shingles.  Take these steps  Don't smoke- Your healthcare provider can help you quit. For tips on how to quit, ask your healthcare provider or go to www.smokefree.gov or call 1-800 QUIT-NOW.  Be physically active- Exercise 5 days a week for a minimum of 30 minutes.  If you are not already physically active, start slow and gradually work up to 30 minutes of moderate physical activity.  Try walking, dancing, bike riding, swimming, etc.  Eat a healthy diet- Eat a variety of healthy foods such as fruits, vegetables, whole grains, low fat milk, low fat cheeses, yogurt, lean meats, chicken, fish, eggs, dried beans, tofu, etc.  For more information go to www.thenutritionsource.org  Dental visit- Brush and floss teeth twice daily; visit your dentist twice a year.  Eye exam- Visit your Optometrist or Ophthalmologist yearly.  Drink alcohol in moderation- Limit alcohol intake to one drink or less a day.  Never drink and drive.  Depression- Your emotional health is as important as your physical health.  If you're feeling down or losing interest in things you normally enjoy, please talk to your healthcare provider.  Seat Belts- can save your life; always wear one  Smoke/Carbon Monoxide detectors- These detectors need to be installed on the appropriate level of your home.  Replace batteries at least once a year.  Violence- If anyone is threatening or hurting you, please tell your healthcare provider.  Living Will/ Health care power of attorney- Discuss with your healthcare provider and family.

## 2014-02-14 NOTE — Progress Notes (Signed)
Subjective:    Patient ID: Mackenzie [name], female    DOB: 06-Jun-1946, 68 y.o.   MRN: [id number]  HPI  This 68 y.o. Cauc female presents for [facility] Subsequent CPE and labs. She has anxiety w/ excellent response to Lexapro 10 mg daily. Pt reports less anxiety and irritability and feels happier. [facility] and this is very helpful for improved mood.  Pt has cystic skin lesions on the trunk only which have been present for 7-8 years. She is able to express "cheesy" type material from these lesions w/ residual scarring. No other symptoms associated w/ these lesions and no family hx of similar skin disorder. Pt thinks it is a problem w/ her immune system.  HCM: PAP- NA (s/p TAH-BSO for benign reason).           MMG- Current; due in July 2015.           DEXA- Due (to be scheduled w/ MMG).           IMM- Current; needs Zostavax.           CRS- Current; pt had complications requiring hospitalization and she states she will not                      undergo that procedure again.           Vision- Current; wears corrective lenses.   Patient Active Problem List   Diagnosis Date Noted  . Atrial fibrillation 11/22/2012  . Hypokalemia 10/15/2012  . Ischemic colitis 10/13/2012  . Acute blood loss anemia 10/13/2012  . Diverticulosis 10/12/2012  . Constipation 10/12/2012  . Hashimoto's thyroiditis 10/12/2012  . Insomnia 03/02/2012  . Anxiety and depression 03/02/2012  . Elevated LDL cholesterol level 03/02/2012  . Palpitations 10/07/2010  . DYSPNEA ON EXERTION 10/07/2010   Prior to Admission medications   Medication Sig Start Date End Date Taking? Authorizing Provider  ALPRAZolam (XANAX) 0.25 MG tablet TAKE 1 TABLET BY MOUTH AT BEDTIME EVERY OTHER NIGHT AS NEEDED FOR SLEEP   Yes Barton Fanny, MD  cholecalciferol (VITAMIN D) 1000 UNITS tablet Take 1,000 Units by mouth daily.   Yes Historical Provider, MD  escitalopram (LEXAPRO) 10 MG tablet Take 1 tablet (10 mg  total) by mouth daily.   Yes Barton Fanny, MD  fish oil-omega-3 fatty acids 1000 MG capsule Take 2 capsules by mouth daily.   Yes Historical Provider, MD  NON FORMULARY 4 (four) times a week.   Yes Historical Provider, MD  OVER THE COUNTER MEDICATION OTC CQ 10 Ubiquinol taking   Yes Historical Provider, MD  OVER THE COUNTER MEDICATION Bee Propilis-Royal Jelly   Yes Historical Provider, MD  OVER THE COUNTER MEDICATION Vit D 3 taking   Yes Historical Provider, MD  polyethylene glycol (MIRALAX / GLYCOLAX) packet Take 17 g by mouth daily. Hold if develops diarrhea 11/24/12  Yes Eugenie Filler, MD  thyroid (ARMOUR) 60 MG tablet Take 60 mg by mouth daily.   Yes Historical Provider, MD  zolpidem (AMBIEN) 10 MG tablet take 1/2 tablet by mouth at bedtime if needed for sleep   Yes Barton Fanny, MD  aspirin 81 MG chewable tablet Chew 1 tablet (81 mg total) by mouth daily. 11/24/12   Eugenie Filler, MD  metoprolol tartrate (LOPRESSOR) 12.5 mg TABS Take 0.5 tablets (12.5 mg total) by mouth at bedtime. 11/30/12   Liliane Shi, PA-C  PMHx, Surg Hx, Soc and Fam Hx reviewed.  Review of Systems  Constitutional: Positive for activity change.       Exercising regularly.  HENT: Positive for mouth sores, postnasal drip and tinnitus.        Taking a "bee- propilis" and "royal jelly" product w/ reduction of oral problems.  Eyes: Negative.   Respiratory: Negative.   Cardiovascular: Negative.   Gastrointestinal: Negative.   Endocrine: Positive for cold intolerance and heat intolerance.       Has hypothyroidism.  Genitourinary: Negative.   Musculoskeletal: Positive for arthralgias and back pain.  Skin: Negative.   Allergic/Immunologic: Negative.   Neurological: Negative.   Hematological: Bruises/bleeds easily.  Psychiatric/Behavioral: Positive for sleep disturbance. The patient is nervous/anxious.        Improved w/ Lexapro.      Objective:   Physical Exam  Nursing note and  vitals reviewed. Constitutional: She is oriented to person, place, and time. Vital signs are normal. She appears well-developed and well-nourished. No distress.  HENT:  Head: Normocephalic and atraumatic.  Right Ear: Hearing, tympanic membrane, external ear and ear canal normal.  Left Ear: Hearing, tympanic membrane, external ear and ear canal normal.  Nose: Nose normal. No nasal deformity or septal deviation.  Mouth/Throat: Uvula is midline, oropharynx is clear and moist and mucous membranes are normal. No oral lesions. Normal dentition. No dental caries.  Eyes: Conjunctivae, EOM and lids are normal. Pupils are equal, round, and reactive to light. No scleral icterus.  Fundoscopic exam:      The right eye shows no exudate, no hemorrhage and no papilledema. The right eye shows red reflex.       The left eye shows no exudate, no hemorrhage and no papilledema. The left eye shows red reflex.  Neck: Trachea normal, normal range of motion and full passive range of motion without pain. Neck supple. No JVD present. No spinous process tenderness and no muscular tenderness present. Carotid bruit is not present. No mass and no thyromegaly present.  Cardiovascular: Normal rate, regular rhythm, S1 normal, S2 normal and normal heart sounds.   No extrasystoles are present. PMI is not displaced.  Exam reveals no gallop and no friction rub.   No murmur heard. Pulses:      Carotid pulses are 2+ on the right side, and 2+ on the left side.      Radial pulses are 2+ on the right side, and 2+ on the left side.       Femoral pulses are 2+ on the right side, and 2+ on the left side.      Dorsalis pedis pulses are 1+ on the right side, and 1+ on the left side.       Posterior tibial pulses are 1+ on the right side, and 1+ on the left side.  Pulmonary/Chest: Effort normal and breath sounds normal. No respiratory distress. Right breast exhibits no inverted nipple, no mass, no nipple discharge, no skin change and no  tenderness. Left breast exhibits no inverted nipple, no mass, no nipple discharge, no skin change and no tenderness. Breasts are symmetrical.  Small flat breasts.  Abdominal: Soft. Normal appearance and bowel sounds are normal. She exhibits no distension, no abdominal bruit, no pulsatile midline mass and no mass. There is no hepatosplenomegaly. There is no tenderness. There is no guarding and no CVA tenderness.  Genitourinary:  Deferred.  Musculoskeletal:       Right shoulder: Normal.       Left shoulder:  Normal.       Right knee: Normal.       Left knee: Normal.       Cervical back: Normal.       Thoracic back: Normal.       Lumbar back: Normal.  Remainder of exam unremarkable.  Lymphadenopathy:       Head (right side): No submental, no submandibular, no tonsillar, no posterior auricular and no occipital adenopathy present.       Head (left side): No submental, no submandibular, no tonsillar, no posterior auricular and no occipital adenopathy present.    She has no cervical adenopathy.    She has no axillary adenopathy.       Right: No inguinal and no supraclavicular adenopathy present.       Left: No inguinal and no supraclavicular adenopathy present.  Neurological: She is alert and oriented to person, place, and time. She has normal strength and normal reflexes. She displays no atrophy and no tremor. No cranial nerve deficit or sensory deficit. She exhibits normal muscle tone. Coordination and gait normal.  Skin: Skin is warm, dry and intact. Rash noted. No ecchymosis, no lesion and no petechiae noted. Rash is papular. She is not diaphoretic. No cyanosis or erythema. No pallor. Nails show no clubbing.  Papular lesions on truck only; central pustular formation; scarring present.  Psychiatric: She has a normal mood and affect. Her speech is normal and behavior is normal. Judgment and thought content normal. Cognition and memory are normal.    Results for orders placed in visit on 02/14/14   POCT URINALYSIS DIPSTICK      Result Value Ref Range   Color, UA yellow     Clarity, UA clear     Glucose, UA neg     Bilirubin, UA neg     Ketones, UA neg     Spec Grav, UA 1.020     Blood, UA mod     pH, UA 6.0     Protein, UA neg     Urobilinogen, UA 0.2     Nitrite, UA neg     Leukocytes, UA Negative         Assessment & Plan:  Routine general medical examination at a health care facility - Plan: POCT urinalysis dipstick, Comprehensive metabolic panel  Elevated LDL cholesterol level - Currently taking Omega-3 Fish Oil.  Plan: Lipid panel  Anemia, unspecified - Plan: CBC with Differential  Skin lesions - Plan: Ambulatory referral to Dermatology  Painless hematuria- Repeat UA in 2 weeks.  Estrogen deficiency - Plan: DG Bone Density  Need for hepatitis C screening test - Plan: Hepatitis C antibody  Meds ordered this encounter  Medications  . OVER THE COUNTER MEDICATION    Sig: OTC CQ 10 Ubiquinol taking  . OVER THE COUNTER MEDICATION    Sig: Bee Propilis-Royal Jelly  . OVER THE COUNTER MEDICATION    Sig: Vit D 3 taking  . ALPRAZolam (XANAX) 0.25 MG tablet    Sig: TAKE 1 TABLET BY MOUTH AT BEDTIME EVERY OTHER NIGHT AS NEEDED FOR SLEEP    Dispense:  30 tablet    Refill:  2  . escitalopram (LEXAPRO) 10 MG tablet    Sig: Take 1 tablet (10 mg total) by mouth daily.    Dispense:  30 tablet    Refill:  11  . zoster vaccine live, PF, (ZOSTAVAX) 22297 UNT/0.65ML injection    Sig: Inject 19,400 Units into the skin once.    Dispense:  1 each    Refill:  0

## 2014-02-15 LAB — LIPID PANEL
CHOL/HDL RATIO: 3.5 ratio
Cholesterol: 188 mg/dL (ref 0–200)
HDL: 53 mg/dL (ref >39)
LDL Cholesterol: 119 mg/dL — ABNORMAL HIGH (ref 0–99)
Triglycerides: 78 mg/dL (ref <150)
VLDL: 16 mg/dL (ref 0–40)

## 2014-02-15 LAB — COMPREHENSIVE METABOLIC PANEL
ALBUMIN: 4.4 g/dL (ref 3.5–5.2)
ALT: 11 U/L (ref 0–35)
AST: 24 U/L (ref 0–37)
Alkaline Phosphatase: 60 U/L (ref 39–117)
BUN: 13 mg/dL (ref 6–23)
CO2: 28 mEq/L (ref 19–32)
CREATININE: 0.6 mg/dL (ref 0.50–1.10)
Calcium: 9.6 mg/dL (ref 8.4–10.5)
Chloride: 104 mEq/L (ref 96–112)
Glucose, Bld: 83 mg/dL (ref 70–99)
Potassium: 4.9 mEq/L (ref 3.5–5.3)
Sodium: 140 mEq/L (ref 135–145)
Total Bilirubin: 0.6 mg/dL (ref 0.2–1.2)
Total Protein: 6.9 g/dL (ref 6.0–8.3)

## 2014-02-15 LAB — HEPATITIS C ANTIBODY: HCV AB: NEGATIVE

## 2014-02-16 NOTE — Progress Notes (Signed)
Quick Note:  Please advise pt regarding following labs...  All labs are normal. Test for Hepatitis C is negative. Blood counts are normal. Sodium, potassium, chloride, calcium and blood sugar as well as kidney and liver function tests are normal.  LDL ("bad") cholesterol is a little above normal but about the same as last year.  Copy to pt. ______

## 2014-02-20 ENCOUNTER — Encounter: Payer: Self-pay | Admitting: Radiology

## 2014-02-27 ENCOUNTER — Ambulatory Visit: Payer: Medicare Other | Admitting: Family Medicine

## 2014-03-02 ENCOUNTER — Encounter: Payer: Self-pay | Admitting: Family Medicine

## 2014-03-02 ENCOUNTER — Ambulatory Visit (INDEPENDENT_AMBULATORY_CARE_PROVIDER_SITE_OTHER): Payer: Medicare Other | Admitting: Family Medicine

## 2014-03-02 VITALS — BP 116/56 | HR 76 | Temp 99.0°F | Resp 16 | Ht 65.0 in | Wt 122.0 lb

## 2014-03-02 DIAGNOSIS — Q619 Cystic kidney disease, unspecified: Secondary | ICD-10-CM

## 2014-03-02 DIAGNOSIS — R319 Hematuria, unspecified: Secondary | ICD-10-CM

## 2014-03-02 DIAGNOSIS — N281 Cyst of kidney, acquired: Secondary | ICD-10-CM

## 2014-03-02 LAB — POCT URINALYSIS DIPSTICK
Bilirubin, UA: NEGATIVE
Glucose, UA: NEGATIVE
Ketones, UA: NEGATIVE
NITRITE UA: NEGATIVE
PH UA: 5.5
PROTEIN UA: NEGATIVE
Spec Grav, UA: 1.01
Urobilinogen, UA: 0.2

## 2014-03-02 NOTE — Patient Instructions (Signed)
A small amount of blood persists in your urine specimen. A ruptured kidney cyst can cause blood in the urine. I have ordered an ultrasound of both kidneys to evaluate the cysts.  If they look like "simple cysts",  further evaluation may not be needed.

## 2014-03-04 NOTE — Progress Notes (Signed)
S:  This 68 y.o. Cauc female is here for reassessment of hematuria. Pt denies fever, flank or abd pain, pelvic pain, dysuria, urinary frequency, gross hematuria or discolored urine. SHe has no hx of kidney stones. She is post-menopausal and denies abnormal vaginal bleeding.  Patient Active Problem List   Diagnosis Date Noted  . Atrial fibrillation 11/22/2012  . Ischemic colitis 10/13/2012  . Acute blood loss anemia 10/13/2012  . Diverticulosis 10/12/2012  . Constipation 10/12/2012  . Hashimoto's thyroiditis 10/12/2012  . Insomnia 03/02/2012  . Anxiety and depression 03/02/2012  . Elevated LDL cholesterol level 03/02/2012  . Palpitations 10/07/2010  . DYSPNEA ON EXERTION 10/07/2010   Prior to Admission medications   Medication Sig Start Date End Date Taking? Authorizing Provider  ALPRAZolam (XANAX) 0.25 MG tablet TAKE 1 TABLET BY MOUTH AT BEDTIME EVERY OTHER NIGHT AS NEEDED FOR SLEEP 02/14/14  Yes Barton Fanny, MD  aspirin 81 MG chewable tablet Chew 1 tablet (81 mg total) by mouth daily. 11/24/12  Yes Eugenie Filler, MD  cholecalciferol (VITAMIN D) 1000 UNITS tablet Take 1,000 Units by mouth daily.   Yes Historical Provider, MD  escitalopram (LEXAPRO) 10 MG tablet Take 1 tablet (10 mg total) by mouth daily. 02/14/14  Yes Barton Fanny, MD  fish oil-omega-3 fatty acids 1000 MG capsule Take 2 capsules by mouth daily.   Yes Historical Provider, MD  metoprolol tartrate (LOPRESSOR) 12.5 mg TABS Take 0.5 tablets (12.5 mg total) by mouth at bedtime. 11/30/12  Yes Scott T Weaver, PA-C  NON FORMULARY 4 (four) times a week.   Yes Historical Provider, MD  OVER THE COUNTER MEDICATION OTC CQ 10 Ubiquinol taking   Yes Historical Provider, MD  OVER THE COUNTER MEDICATION Bee Propilis-Royal Jelly   Yes Historical Provider, MD  OVER THE COUNTER MEDICATION Vit D 3 taking   Yes Historical Provider, MD  polyethylene glycol (MIRALAX / GLYCOLAX) packet Take 17 g by mouth daily. Hold if develops  diarrhea 11/24/12  Yes Eugenie Filler, MD  thyroid (ARMOUR) 60 MG tablet Take 60 mg by mouth daily.   Yes Historical Provider, MD  zolpidem (AMBIEN) 10 MG tablet take 1/2 tablet by mouth at bedtime if needed for sleep   Yes Barton Fanny, MD  zoster vaccine live, PF, (ZOSTAVAX) 28413 UNT/0.65ML injection Inject 19,400 Units into the skin once. 02/14/14  Yes Barton Fanny, MD   PMHx, Surg Hx, Soc and Fam Hx reviewed.  ROS: As per HPI.  O: Filed Vitals:   03/02/14 1315  BP: 116/56  Pulse: 76  Temp: 99 F (37.2 C)  Resp: 16   GEN: In NAD; WN,WD.  HENT: [place]/AT; EOMI w/ clear conj/sclerae. Otherwise unremarkable. COR: RRR. LUNGS: Unlabored resp. SKIN: W&D; intact w/o diaphoresis, erythema or pallor. NEURO: A&O x 3; CNs intact. Nonfocal. PSYCH: Pleasant and slightly anxious; speech and thought content normal. Judgement sound.  CT Abdomen Pelvis w/ Contrast (10/12/2012): Bilateral renal cysts-  Upper pole of R kidney has exophytic simple and measures 3.4 cm; posterior cyst on the L kidney measures 6 mm. Kidneys unremarkbale otherwise and ureters are within normal limits.  Results for orders placed in visit on 03/02/14  POCT URINALYSIS DIPSTICK      Result Value Ref Range   Color, UA yellow     Clarity, UA clear     Glucose, UA neg     Bilirubin, UA neg     Ketones, UA neg     Spec Grav, UA  1.010     Blood, UA small     pH, UA 5.5     Protein, UA neg     Urobilinogen, UA 0.2     Nitrite, UA neg     Leukocytes, UA Trace      A/P: Hematuria - Plan: POCT urinalysis dipstick, US Renal  Bilateral renal cysts - Rupture of a renal cyst could result in hematuria.   Plan: US Renal

## 2014-03-07 ENCOUNTER — Other Ambulatory Visit: Payer: Self-pay

## 2014-03-07 ENCOUNTER — Other Ambulatory Visit: Payer: Self-pay | Admitting: Family Medicine

## 2014-03-07 DIAGNOSIS — Z1231 Encounter for screening mammogram for malignant neoplasm of breast: Secondary | ICD-10-CM

## 2014-03-07 DIAGNOSIS — E2839 Other primary ovarian failure: Secondary | ICD-10-CM

## 2014-03-08 ENCOUNTER — Ambulatory Visit
Admission: RE | Admit: 2014-03-08 | Discharge: 2014-03-08 | Disposition: A | Discharge status: Home / Self Care | Payer: Medicare Other | Source: Ambulatory Visit | Attending: Family Medicine | Admitting: Family Medicine

## 2014-03-08 DIAGNOSIS — R319 Hematuria, unspecified: Secondary | ICD-10-CM

## 2014-03-08 DIAGNOSIS — N281 Cyst of kidney, acquired: Secondary | ICD-10-CM

## 2014-03-09 ENCOUNTER — Telehealth: Payer: Self-pay

## 2014-03-09 NOTE — Telephone Encounter (Signed)
Dr.Mcpherson, Pt would like to know the results from her ultrasound. Best# [phone]

## 2014-03-10 NOTE — Telephone Encounter (Signed)
See [place] report. Pt has been notified.

## 2014-03-12 ENCOUNTER — Telehealth: Payer: Self-pay | Admitting: Family Medicine

## 2014-03-12 DIAGNOSIS — R319 Hematuria, unspecified: Secondary | ICD-10-CM

## 2014-03-12 NOTE — Telephone Encounter (Signed)
appt made and future orders placed for CC urine dip and micro based on provider notes.

## 2014-03-12 NOTE — Telephone Encounter (Signed)
Message copied by Chinita Pester on Mon Mar 12, 2014  2:35 PM ------      Message from: Barton Fanny      Created: Sat Mar 10, 2014  5:30 PM       Advise pt to come in to 104 to do a CC urine for dip and micro towards end of July or early August (lab visit only); this will be 4-6 weeks after last UA.   Thank you. ------

## 2014-04-02 ENCOUNTER — Ambulatory Visit
Admission: RE | Admit: 2014-04-02 | Discharge: 2014-04-02 | Disposition: A | Discharge status: Home / Self Care | Payer: Medicare Other | Source: Ambulatory Visit

## 2014-04-02 ENCOUNTER — Encounter (INDEPENDENT_AMBULATORY_CARE_PROVIDER_SITE_OTHER): Payer: Self-pay

## 2014-04-02 ENCOUNTER — Ambulatory Visit
Admission: RE | Admit: 2014-04-02 | Discharge: 2014-04-02 | Disposition: A | Discharge status: Home / Self Care | Payer: Medicare Other | Source: Ambulatory Visit | Attending: Family Medicine | Admitting: Family Medicine

## 2014-04-02 DIAGNOSIS — E2839 Other primary ovarian failure: Secondary | ICD-10-CM

## 2014-04-02 DIAGNOSIS — Z1231 Encounter for screening mammogram for malignant neoplasm of breast: Secondary | ICD-10-CM

## 2014-04-03 ENCOUNTER — Other Ambulatory Visit: Payer: Self-pay | Admitting: Family Medicine

## 2014-04-03 ENCOUNTER — Telehealth: Payer: Self-pay

## 2014-04-03 DIAGNOSIS — R928 Other abnormal and inconclusive findings on diagnostic imaging of breast: Secondary | ICD-10-CM

## 2014-04-03 NOTE — Telephone Encounter (Signed)
Pt states she has received some news today that has made her very anxious and is requesting somethin to be called in for her Please call pt at [phone]

## 2014-04-04 MED ORDER — ALPRAZOLAM 0.25 MG PO TABS: ORAL_TABLET | ORAL | Status: DC

## 2014-04-04 NOTE — Telephone Encounter (Signed)
I spoke w/ Mackenzie [name] who is very anxious about further breast imaging on Monday, April 09, 2014. She takes Alprazolam for sleep but has increased dosage to 2x daily. She is still taking Lexapro but is exteremly anxious; she has a hx of very dense breasts and this is her 3rd year of call-back (despite having TOMO). "I've got one foot in the grave already!" I phoned her pharmacy and increased Alprazolam 0.25 mg quantity to #40 w/ 2 RFs and Sig: 1 tab bid prn.Pt has appt on Friday to have UA rechecked for microscopic blood.

## 2014-04-04 NOTE — Telephone Encounter (Signed)
Pt needs something other than the Xanax. She states that she is going to run out. She is using 2 a day right now. She had an abnormal mammogram- she is unable to get an appt with the breast center until Monday. Pt would prefer a call back from Dr. Leward Quan.

## 2014-04-06 ENCOUNTER — Ambulatory Visit (INDEPENDENT_AMBULATORY_CARE_PROVIDER_SITE_OTHER): Payer: Medicare Other | Admitting: Family Medicine

## 2014-04-06 ENCOUNTER — Encounter: Payer: Self-pay | Admitting: Family Medicine

## 2014-04-06 VITALS — BP 99/56 | HR 99 | Temp 99.0°F | Resp 16 | Ht 65.0 in | Wt 125.0 lb

## 2014-04-06 DIAGNOSIS — F419 Anxiety disorder, unspecified: Secondary | ICD-10-CM

## 2014-04-06 DIAGNOSIS — F341 Dysthymic disorder: Secondary | ICD-10-CM

## 2014-04-06 DIAGNOSIS — R319 Hematuria, unspecified: Secondary | ICD-10-CM

## 2014-04-06 DIAGNOSIS — D3000 Benign neoplasm of unspecified kidney: Secondary | ICD-10-CM

## 2014-04-06 DIAGNOSIS — D3002 Benign neoplasm of left kidney: Secondary | ICD-10-CM

## 2014-04-06 DIAGNOSIS — F329 Major depressive disorder, single episode, unspecified: Secondary | ICD-10-CM

## 2014-04-06 LAB — POCT URINALYSIS DIPSTICK
BILIRUBIN UA: NEGATIVE
Glucose, UA: NEGATIVE
KETONES UA: NEGATIVE
NITRITE UA: NEGATIVE
PH UA: 5.5
PROTEIN UA: NEGATIVE
Spec Grav, UA: 1.02
Urobilinogen, UA: 0.2

## 2014-04-06 LAB — POCT UA - MICROSCOPIC ONLY
Bacteria, U Microscopic: NEGATIVE
CASTS, UR, LPF, POC: NEGATIVE
Crystals, Ur, HPF, POC: NEGATIVE
Mucus, UA: NEGATIVE
YEAST UA: NEGATIVE

## 2014-04-06 NOTE — Progress Notes (Signed)
S:  This 68 y.o. Cauc female has persistent microscopic hematuria and is here for follow-up. She has no dysuria, pelvic or flank pain. Past surgical hx is significant for bladder suspension surgery ~ 7 years ago; this was performed at [facility] in [place]. Pt has no discomfort, fever/chills or UTI symptoms. Pt had renal ultrasound 4 weeks ago; bilateral renal cysts and a solitary L renal lesion seen; this report is reviewed in detail w/ pt.  Pt has significant anxiety, exacerbated by recent abnormal mammogram; she has follow-up imaging scheduled for Monday, April 09, 2014.  She is concerned about cancer because her husband had bladder cancer (completed treatment and "cured"), several friends/family have been diagnosed with cancer. Alprazolam is effective when taken 1 tablet bid.  Patient Active Problem List   Diagnosis Date Noted  . Atrial fibrillation 11/22/2012  . Ischemic colitis 10/13/2012  . Acute blood loss anemia 10/13/2012  . Diverticulosis 10/12/2012  . Constipation 10/12/2012  . Hashimoto's thyroiditis 10/12/2012  . Insomnia 03/02/2012  . Anxiety and depression 03/02/2012  . Elevated LDL cholesterol level 03/02/2012  . Palpitations 10/07/2010  . DYSPNEA ON EXERTION 10/07/2010    Outpatient Encounter Prescriptions as of 04/06/2014  Medication Sig Note  . ALPRAZolam (XANAX) 0.25 MG tablet Take 1 tablet by mouth twice daily prn anxiety and/or sleep.   . cholecalciferol (VITAMIN D) 1000 UNITS tablet Take 1,000 Units by mouth daily.   Marland Kitchen escitalopram (LEXAPRO) 10 MG tablet Take 1 tablet (10 mg total) by mouth daily.   . fish oil-omega-3 fatty acids 1000 MG capsule Take 2 capsules by mouth daily.   . metoprolol tartrate (LOPRESSOR) 12.5 mg TABS Take 0.5 tablets (12.5 mg total) by mouth at bedtime.   . NON FORMULARY 4 (four) times a week. 12/09/2012: TUMERIC  . OVER THE COUNTER MEDICATION OTC CQ 10 Ubiquinol taking   . OVER THE COUNTER MEDICATION Bee Propilis-Royal Jelly   . OVER  THE COUNTER MEDICATION Vit D 3 taking   . polyethylene glycol (MIRALAX / GLYCOLAX) packet Take 17 g by mouth daily. Hold if develops diarrhea   . thyroid (ARMOUR) 60 MG tablet Take 60 mg by mouth daily.   Marland Kitchen zolpidem (AMBIEN) 10 MG tablet take 1/2 tablet by mouth at bedtime if needed for sleep   . aspirin 81 MG chewable tablet Chew 1 tablet (81 mg total) by mouth daily.   Marland Kitchen zoster vaccine live, PF, (ZOSTAVAX) 28413 UNT/0.65ML injection Inject 19,400 Units into the skin once.     Allergies  Allergen Reactions  . Clindamycin Hives  . Clonazepam Other (See Comments)    Nasal congestion.  . Vancomycin Hcl Other (See Comments)    Not sure which medication gave her whelps but this was administered with the clindamycin     PMHx, Surg Hx, Soc and Fam Hx reviewed.   ROS: As per HPI; otherwise noncontributory.   O: Filed Vitals:   04/06/14 1332  BP: 99/56  Pulse: 99  Temp: 99 F (37.2 C)  Resp: 16   GEN: In NAD; anxious and tense. HENT: [place]/AT; EOMI w/ clear conj/sclerae. Ext ears/nose/oroph unremarkable. COR: RRR. LUNGS: Unlabored resp. SKIN: W&D; intact; intact w/o erythema, diaphoresis or pallor. NEURO: A&Ox 3;CNs intact. Nonfocal.   Results for orders placed in visit on 04/06/14  POCT URINALYSIS DIPSTICK      Result Value Ref Range   Color, UA yellow     Clarity, UA clear     Glucose, UA neg     Bilirubin,  UA neg     Ketones, UA neg     Spec Grav, UA 1.020     Blood, UA moderate     pH, UA 5.5     Protein, UA neg     Urobilinogen, UA 0.2     Nitrite, UA neg     Leukocytes, UA Trace    POCT UA - MICROSCOPIC ONLY      Result Value Ref Range   WBC, Ur, HPF, POC 0-2     RBC, urine, microscopic 8-10     Bacteria, U Microscopic neg     Mucus, UA neg     Epithelial cells, urine per micros 0-1     Crystals, Ur, HPF, POC neg     Casts, Ur, LPF, POC neg     Yeast, UA neg      A/P: Hematuria, unspecified - Plan: POCT Urinalysis Dipstick, POCT UA - Microscopic Only,  Ambulatory referral to Urology  Angiomyolipoma of kidney, left - I reviewed the current literature re: solitary AML (kidney); pt's 8 mm lesion is stable and warrants follow-up (most likely w/ ultrasound in 6-12 months). This is most likely the cause of hematuria. Pt is not at risk for hemorrhage. I discussed the option of having this problem evaluated by a urologist; given pt's level of anxiety, she opts for referral to specialist.  She has plans over the next few weeks so she is comfortable waiting until mid to late August for Urology appt.      Plan: Ambulatory referral to Urology  Anxiety and depression- Pt will continue current medications; contact clinic if anxiety increases.  Face-to-Face time with pt = 20-25 minutes.

## 2014-04-06 NOTE — Patient Instructions (Signed)
The renal ultrasound shows an 8 mm lesion identified as Angiomyolipoma; this is stable compared to a CT performed in 2014.  This could be the cause of the blood in your urine; it is a solitary (one and only) mass seen on ultrasound. Because of its size and it is stable, repeat ultrasound in 1 year is recommended. If you would like to see a urologist for further evaluation and advise about this, I can refer you to the specialist.

## 2014-04-07 ENCOUNTER — Encounter: Payer: Self-pay | Admitting: Family Medicine

## 2014-04-09 ENCOUNTER — Ambulatory Visit
Admission: RE | Admit: 2014-04-09 | Discharge: 2014-04-09 | Disposition: A | Discharge status: Home / Self Care | Payer: Medicare Other | Source: Ambulatory Visit | Attending: Family Medicine | Admitting: Family Medicine

## 2014-04-09 DIAGNOSIS — R928 Other abnormal and inconclusive findings on diagnostic imaging of breast: Secondary | ICD-10-CM

## 2014-04-10 ENCOUNTER — Telehealth: Payer: Self-pay | Admitting: Family Medicine

## 2014-04-10 DIAGNOSIS — M2010 Hallux valgus (acquired), unspecified foot: Secondary | ICD-10-CM

## 2014-04-10 NOTE — Telephone Encounter (Signed)
This phone call is documented elsewhere. See phone note dated 04/10/2014.

## 2014-04-10 NOTE — Telephone Encounter (Signed)
I phoned pt to discuss normal breast imaging. She is very relieved that no cancer was found. She has chronic foot pain due to a bunion; this is interfering w/ her ability exercise or even walk. She wears orthotics but still has pain. I ordered Podiatry evaluation w/ Dr. Blenda Mounts at [facility].

## 2014-04-11 ENCOUNTER — Encounter: Payer: Self-pay | Admitting: Family Medicine

## 2014-04-11 DIAGNOSIS — M810 Age-related osteoporosis without current pathological fracture: Secondary | ICD-10-CM | POA: Insufficient documentation

## 2014-05-22 ENCOUNTER — Ambulatory Visit (INDEPENDENT_AMBULATORY_CARE_PROVIDER_SITE_OTHER): Payer: Medicare Other

## 2014-05-22 VITALS — BP 109/53 | HR 78 | Resp 12

## 2014-05-22 DIAGNOSIS — R52 Pain, unspecified: Secondary | ICD-10-CM

## 2014-05-22 DIAGNOSIS — M2012 Hallux valgus (acquired), left foot: Secondary | ICD-10-CM

## 2014-05-22 DIAGNOSIS — M2010 Hallux valgus (acquired), unspecified foot: Secondary | ICD-10-CM

## 2014-05-22 DIAGNOSIS — M778 Other enthesopathies, not elsewhere classified: Secondary | ICD-10-CM

## 2014-05-22 DIAGNOSIS — M7750 Other enthesopathy of unspecified foot: Secondary | ICD-10-CM

## 2014-05-22 DIAGNOSIS — M779 Enthesopathy, unspecified: Secondary | ICD-10-CM

## 2014-05-22 NOTE — Progress Notes (Signed)
   Subjective:    Patient ID: Mackenzie [name], female    DOB: 02/07/1946, 68 y.o.   MRN: [id number]  HPI  PT STATED LT FOOT BUNION AND OUTSIDE OF THE FOOT IS BEEN PAINFUL FOR 30 YEARS. THE FOOT IS GETTING WORSE, ESPECIALLY THE JOINT. THE FOOT GET AGGRAVATED WHEN WALKING AND EXERCISING. TRIED NO TREATMENT.  Review of Systems  HENT:       RINGING EARS   Hematological: Bruises/bleeds easily.  Psychiatric/Behavioral: The patient is nervous/anxious.   All other systems reviewed and are negative.      Objective:   Physical Exam 68 year old white female well-developed well-nourished oriented x3 with vital signs stable. Patient has a complaint of painful bunion left foot seems to be getting worse more painful with activities walking certain shoes also some pain points the fifth metatarsal base or lateral Lisfranc joint of the left foot. Right foot has bunion deformity although asymptomatic at current time. Patient wearing good shoes and hurts type shoe or sandal shoes with support instructor at most times should note neurovascular status is intact pedal pulses are palpable DP and PT +2/4 bilateral capillary refill time 3 seconds all digits epicritic and proprioceptive sensations intact and symmetric bilateral there is normal plantar response DTRs not elicited dermatologically skin color pigment normal hair growth absent nails unremarkable there orthopedic exam reveals notable bunion deformity bilateral with prominence of the first MTP area lateral deviation of hallux bilateral left more so than right. There is pain with enclosed shoe wear and palpation of the first MTP area left with some limited range of motion x-rays reveal deviation sesamoid position 5-6 I am angle greater than 14 significant promontory changes noted clinically and radiographically with while fascial thickening mild inferior lateral and posterior calcaneal spurring noted anterior break in the cyma line is identified mild early arthritic  changes noted at Lisfranc for slight subluxation Lisfranc for 5 and cuboid articulation posse associated with compensatory gait changes. 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 Plan at this time patient is a candidate for surgical intervention as far as the capsulitis a Lisfranc's would benefit from continue orthotics her orthotic adjustments in the future at this time consent form for [facility] bunionectomy with pin or screw fixations reviewed signed for left foot all questions asked medication are answered there no complications surgery scheduled sometime after Thanksgiving per patient request. In the interim maintain good stable shoes may be candidate for prescription orthoses in the future for adjustments of OTC orthotics. 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  Harriet Masson DPM

## 2014-05-22 NOTE — Patient Instructions (Signed)
Pre-Operative Instructions  Congratulations, you have decided to take an important step to improving your quality of life.  You can be assured that the doctors of [facility] will be with you every step of the way.  1. Plan to be at the surgery center/hospital at least 1 (one) hour prior to your scheduled time unless otherwise directed by the surgical center/hospital staff.  You must have a responsible adult accompany you, remain during the surgery and drive you home.  Make sure you have directions to the surgical center/hospital and know how to get there on time. 2. For hospital based surgery you will need to obtain a history and physical form from your family physician within 1 month prior to the date of surgery- we will give you a form for you primary physician.  3. We make every effort to accommodate the date you request for surgery.  There are however, times where surgery dates or times have to be moved.  We will contact you as soon as possible if a change in schedule is required.   4. No Aspirin/Ibuprofen for one week before surgery.  If you are on aspirin, any non-steroidal anti-inflammatory medications (Mobic, Aleve, Ibuprofen) you should stop taking it 7 days prior to your surgery.  You make take Tylenol  For pain prior to surgery.  5. Medications- If you are taking daily heart and blood pressure medications, seizure, reflux, allergy, asthma, anxiety, pain or diabetes medications, make sure the surgery center/hospital is aware before the day of surgery so they may notify you which medications to take or avoid the day of surgery. 6. No food or drink after midnight the night before surgery unless directed otherwise by surgical center/hospital staff. 7. No alcoholic beverages 24 hours prior to surgery.  No smoking 24 hours prior to or 24 hours after surgery. 8. Wear loose pants or shorts- loose enough to fit over bandages, boots, and casts. 9. No slip on shoes, sneakers are best. 10. Bring  your boot with you to the surgery center/hospital.  Also bring crutches or a walker if your physician has prescribed it for you.  If you do not have this equipment, it will be provided for you after surgery. 11. If you have not been contracted by the surgery center/hospital by the day before your surgery, call to confirm the date and time of your surgery. 12. Leave-time from work may vary depending on the type of surgery you have.  Appropriate arrangements should be made prior to surgery with your employer. 13. Prescriptions will be provided immediately following surgery by your doctor.  Have these filled as soon as possible after surgery and take the medication as directed. 14. Remove nail polish on the operative foot. 15. Wash the night before surgery.  The night before surgery wash the foot and leg well with the antibacterial soap provided and water paying special attention to beneath the toenails and in between the toes.  Rinse thoroughly with water and dry well with a towel.  Perform this wash unless told not to do so by your physician.  Enclosed: 1 Ice pack (please put in freezer the night before surgery)   1 Hibiclens skin cleaner   Pre-op Instructions  If you have any questions regarding the instructions, do not hesitate to call our office.  [place]: [address] [place], [place] [address] [phone]  [place]: [address] [place], [place] [address] [phone]  [place]: [address]  [place], [place] [address] [phone]  Dr. Richard   Tuchman DPM, Dr. Norman Regal DPM Dr. Richard Sikora DPM, Dr. M. Todd Hyatt DPM, Dr. Kathryn Egerton DPM 

## 2014-06-03 ENCOUNTER — Other Ambulatory Visit: Payer: Self-pay | Admitting: Family Medicine

## 2014-06-05 NOTE — Telephone Encounter (Signed)
Zolpidem 10 mg tablet- refill phoned to pt's pharmacy.

## 2014-08-22 DIAGNOSIS — M2012 Hallux valgus (acquired), left foot: Secondary | ICD-10-CM

## 2014-08-22 IMAGING — CR DG CHEST 1V PORT
1 series · 1 of 1 positions shown · non-contrast
Comparison: CT abdomen 10/12/2012

CLINICAL DATA: Short of breath

PORTABLE CHEST - 1 VIEW

[AP]
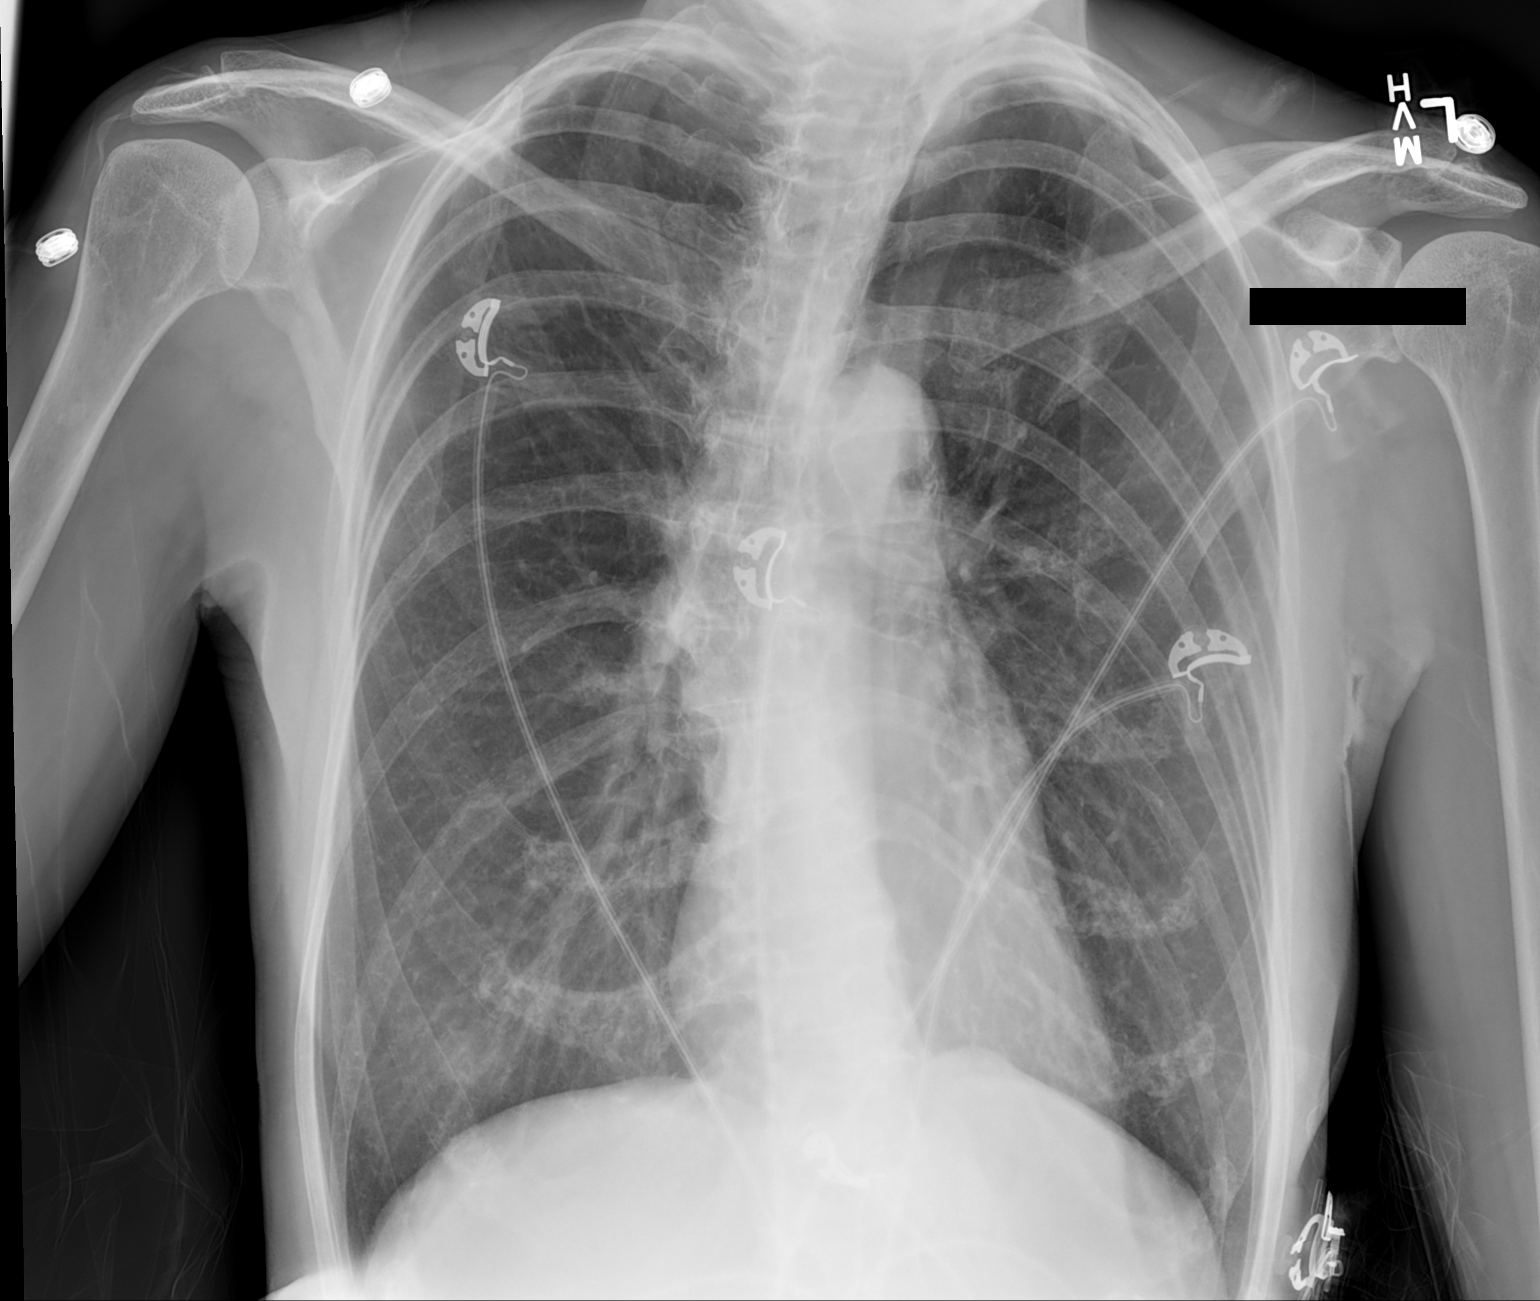

[1 of 1 positions shown; findings below may reference images not displayed]

FINDINGS: Normal mediastinum and cardiac silhouette.  Lungs are
hyperinflated.  No effusion, infiltrate, or pneumothorax.
IMPRESSION: Hyperinflated lungs.  No acute findings.]

## 2014-08-23 ENCOUNTER — Telehealth: Payer: Self-pay | Admitting: *Deleted

## 2014-08-23 ENCOUNTER — Other Ambulatory Visit: Payer: Self-pay

## 2014-08-23 MED ORDER — PROMETHAZINE HCL 25 MG PO TABS: 25.0000 mg | ORAL_TABLET | Freq: Three times a day (TID) | ORAL | Status: DC | PRN

## 2014-08-23 NOTE — Telephone Encounter (Signed)
Per pt received flu injection at [facility].

## 2014-08-23 NOTE — Progress Notes (Signed)
Patient called about nausea from her pain medication. Next  A prescription for Phenergan 25 mg every 8 hours as needed for nausea was [facility] in [address] patient was also called and advised that the prescription has been forwarded to the pharmacy for someone to pick up. With the patient otherwise doing well no other major complaints or difficulties moderate pain or discomfort noted is to with postop course    Mackenzie [name] DPM

## 2014-08-23 NOTE — Telephone Encounter (Signed)
Spoke with pt she received flu shot at [facility]

## 2014-08-23 NOTE — Telephone Encounter (Signed)
Patient needing something for nausea. Dr. Blenda Mounts called it in for patient already

## 2014-08-28 ENCOUNTER — Ambulatory Visit (INDEPENDENT_AMBULATORY_CARE_PROVIDER_SITE_OTHER): Payer: Medicare Other

## 2014-08-28 VITALS — BP 95/51 | HR 59 | Resp 18

## 2014-08-28 DIAGNOSIS — M7752 Other enthesopathy of left foot: Secondary | ICD-10-CM

## 2014-08-28 DIAGNOSIS — M778 Other enthesopathies, not elsewhere classified: Secondary | ICD-10-CM

## 2014-08-28 DIAGNOSIS — M779 Enthesopathy, unspecified: Secondary | ICD-10-CM

## 2014-08-28 DIAGNOSIS — Z09 Encounter for follow-up examination after completed treatment for conditions other than malignant neoplasm: Secondary | ICD-10-CM

## 2014-08-28 DIAGNOSIS — M2012 Hallux valgus (acquired), left foot: Secondary | ICD-10-CM

## 2014-08-28 MED ORDER — TRAMADOL HCL 50 MG PO TABS: 50.0000 mg | ORAL_TABLET | Freq: Three times a day (TID) | ORAL | Status: DC | PRN

## 2014-08-28 NOTE — Progress Notes (Signed)
   Subjective:    Patient ID: Mackenzie [name], female    DOB: November 15, 1945, 68 y.o.   MRN: [id number]  HPI MY LEFT FOOT HAS BEEN HURTING SINCE THE SURGERY AND THERE IS A SPOT ON THE SIDE OF MY FOOT THAT IS REALLY TENDER AND SORE AND I HAD MY SURGERY ON 08/22/14    Review of Systems no new findings or systemic changes noted     Objective:   Physical Exam Patient presents 6 days status post [place] bunionectomy left foot mild edema and ecchymosis noted consistent with postop course were patient Cates she's having a lot of pain or discomfort has very low tolerance patient is taking a lot of other medications and been taking to the Percocet and is hasn't helped also been using 800 mg of IV propofol if no improvement or pain mostly throbbing and aching noted. No open wounds no dehiscence no discharge or drainage no increased temperature no signs of infection noted pedal pulses palpable epicritic appropriate septa sensations intact. Range of motion [place] 34 dorsiflexion 10 plantar flexion noted       Assessment & Plan:  Assessment good postop progress presto compressive dressing reapplied there is some ecchymosis lateral fifth metatarsal base posse type bandage some padding is applied patient will maintain air fracture boot for at least one more month reappoint one week plan for dressing change at that time maintain surgical shoe at all tender boot at all times may do 100 toe pushup exercises daily dividing throughout the day of her maintain dressing intact and clean and dry for 1 more week week for postop follow-up. Also prescription for tramadol is issued at this time one every 8 hours when necessary pain may do this as an adjunct to ibuprofen and alternative to the oxycodone.  Harriet Masson DPM

## 2014-08-28 NOTE — Patient Instructions (Signed)
ICE INSTRUCTIONS  Apply ice or cold pack to the affected area at least 3 times a day for 10-15 minutes each time.  You should also use ice after prolonged activity or vigorous exercise.  Do not apply ice longer than 20 minutes at one time.  Always keep a cloth between your skin and the ice pack to prevent burns.  Being consistent and following these instructions will help control your symptoms.  We suggest you purchase a gel ice pack because they are reusable and do bit leak.  Some of them are designed to wrap around the area.  Use the method that works best for you.  Here are some other suggestions for icing.   Use a frozen bag of peas or corn-inexpensive and molds well to your body, usually stays frozen for 10 to 20 minutes.  Wet a towel with cold water and squeeze out the excess until it's damp.  Place in a bag in the freezer for 20 minutes. Then remove and use.  Also can use ibuprofen 2 tablets 3 times daily as needed for pain in between your other medication doses

## 2014-09-11 ENCOUNTER — Ambulatory Visit (INDEPENDENT_AMBULATORY_CARE_PROVIDER_SITE_OTHER): Payer: Medicare Other

## 2014-09-11 ENCOUNTER — Ambulatory Visit: Payer: Medicare Other

## 2014-09-11 VITALS — BP 99/86 | HR 68 | Resp 12

## 2014-09-11 DIAGNOSIS — Z09 Encounter for follow-up examination after completed treatment for conditions other than malignant neoplasm: Secondary | ICD-10-CM

## 2014-09-11 DIAGNOSIS — M2012 Hallux valgus (acquired), left foot: Secondary | ICD-10-CM

## 2014-09-11 DIAGNOSIS — M779 Enthesopathy, unspecified: Secondary | ICD-10-CM

## 2014-09-11 DIAGNOSIS — M7752 Other enthesopathy of left foot: Secondary | ICD-10-CM

## 2014-09-11 DIAGNOSIS — M778 Other enthesopathies, not elsewhere classified: Secondary | ICD-10-CM

## 2014-09-11 NOTE — Patient Instructions (Signed)
ICE INSTRUCTIONS  Apply ice or cold pack to the affected area at least 3 times a day for 10-15 minutes each time.  You should also use ice after prolonged activity or vigorous exercise.  Do not apply ice longer than 20 minutes at one time.  Always keep a cloth between your skin and the ice pack to prevent burns.  Being consistent and following these instructions will help control your symptoms.  We suggest you purchase a gel ice pack because they are reusable and do bit leak.  Some of them are designed to wrap around the area.  Use the method that works best for you.  Here are some other suggestions for icing.   Use a frozen bag of peas or corn-inexpensive and molds well to your body, usually stays frozen for 10 to 20 minutes.  Wet a towel with cold water and squeeze out the excess until it's damp.  Place in a bag in the freezer for 20 minutes. Then remove and use.  Maintain compression stocking during the day and air fracture boot not walk without the boot. Do toe exercises proxy 100 toe pushups a day divided throughout the day

## 2014-09-11 NOTE — Progress Notes (Signed)
   Subjective:    Patient ID: Mackenzie [name], female    DOB: December 07, 1945, 68 y.o.   MRN: [id number]  HPI  DOS 08/22/14 BUNIONECTOMY LT.  ''LT IS DOING GOOD AND MUCH BETTER.''  Review of Systems no new findings or systemic changes noted     Objective:   Physical Exam Neurovascular status is intact pedal pulses are palpable epicritic and proprioceptive sensations intact patient is been doing range of motion exercises instructed incision clean dry well coapted this time dressings are removed on anklet was dispensed to maintain compression also some padding for the fifth metatarsal base were there is irritation from type bandage. Do 100 toe pushup today as instructed maintain ice and elevation when possible       Assessment & Plan:  An anklet is dispensed maintain compression maintain air fracture boot for 2 more weeks reappointed 2-3 weeks for follow-up x-ray and likely return to walking tennis or athletic shoe thereafter. Maintain exercises as instructed Tylenol as needed for pain ice and range of motion exercises every evening.  Harriet Masson DPM

## 2014-10-01 ENCOUNTER — Other Ambulatory Visit: Payer: Self-pay | Admitting: Family Medicine

## 2014-10-02 ENCOUNTER — Ambulatory Visit (INDEPENDENT_AMBULATORY_CARE_PROVIDER_SITE_OTHER): Payer: Medicare Other

## 2014-10-02 VITALS — BP 116/70 | HR 74 | Resp 12

## 2014-10-02 DIAGNOSIS — M2012 Hallux valgus (acquired), left foot: Secondary | ICD-10-CM

## 2014-10-02 DIAGNOSIS — Z09 Encounter for follow-up examination after completed treatment for conditions other than malignant neoplasm: Secondary | ICD-10-CM

## 2014-10-02 NOTE — Progress Notes (Signed)
   Subjective:    Patient ID: Mackenzie [name], female    DOB: 13-Oct-1945, 69 y.o.   MRN: [id number]  HPI DOS 08/22/14 BUNIONECTOMY LT. ''LT FOOT IS DOING OK.''   Review of Systems no new findings or systemic changes noted     Objective:   Physical Exam Neurovascular status is intact pedal pulses are palpable epicritic and proprioceptive sensations unchanged patient is status post 1 month [place] bunionectomy left foot with fixation patient is doing well mild edema and ecchymosis consistent with postop course good range of motion although needs to continue with active passive range of motion exercises x-rays reveal good position the osteotomy and fixation K wire intact nondisplaced. The wound is well coapted no dehiscence no signs of infection is noted       Assessment & Plan:  Assessment good postop progress return in 2 months for long-term postop follow-up may discontinue boot resume couple walking tennis or athletic shoe continue with aggressive range of motion exercises maintaining compression wrap suggested Tylenol or Advil for you for pain. Months follow-up scheduled next  Harriet Masson DPM

## 2014-10-02 NOTE — Patient Instructions (Signed)

## 2014-12-04 ENCOUNTER — Ambulatory Visit (INDEPENDENT_AMBULATORY_CARE_PROVIDER_SITE_OTHER): Payer: Medicare Other

## 2014-12-04 VITALS — BP 127/79 | HR 88 | Resp 12

## 2014-12-04 DIAGNOSIS — M2012 Hallux valgus (acquired), left foot: Secondary | ICD-10-CM

## 2014-12-04 DIAGNOSIS — M7752 Other enthesopathy of left foot: Secondary | ICD-10-CM

## 2014-12-04 DIAGNOSIS — M778 Other enthesopathies, not elsewhere classified: Secondary | ICD-10-CM

## 2014-12-04 DIAGNOSIS — Z09 Encounter for follow-up examination after completed treatment for conditions other than malignant neoplasm: Secondary | ICD-10-CM

## 2014-12-04 DIAGNOSIS — M779 Enthesopathy, unspecified: Secondary | ICD-10-CM

## 2014-12-04 NOTE — Progress Notes (Signed)
   Subjective:    Patient ID: Mackenzie [name], female    DOB: 1946/08/05, 69 y.o.   MRN: [id number]  HPI DOS 08/22/14 BUNIONECTOMY LT. ''LT FOOT STILL GET SORE WHEN WALKING.''   Review of Systems no new findings or systemic changes     Objective:   Physical Exam Neurovascular status is intact pedal pulses are palpable incision clean dry well coapted still some mild edema around first MTP area left foot range of motion is greatly improved as at least 50 dorsiflexion 10-15 plantar flexion still some slight stiffness and swelling around the MTP joint noted no crepitus no dehiscence no discharge mild tenderness with walking to these ambulation maintaining good shoes and orthoses at all times. Pedal pulses are palpable no other abnormalities are identified       Assessment & Plan:  Assessment this time is good postop progress find bunionectomy left foot with K wire fixation patient will maintain continued increase activities discharge to an as-needed basis for any future follow-up is advised swelling and stiffness can last 3-6 months or longer following surgery will contact us if there is any further issues in the future  Harriet Masson DPM

## 2015-01-30 ENCOUNTER — Telehealth: Payer: Self-pay

## 2015-01-30 NOTE — Telephone Encounter (Signed)
Left message for pt to call back  °

## 2015-01-30 NOTE — Telephone Encounter (Signed)
Dr.Mcpherson,  Pt would like to speak with you regarding changing her medications.  Best# [phone]

## 2015-01-31 MED ORDER — ESCITALOPRAM OXALATE 10 MG PO TABS: 10.0000 mg | ORAL_TABLET | Freq: Every day | ORAL | Status: DC

## 2015-01-31 MED ORDER — ZOLPIDEM TARTRATE 10 MG PO TABS: ORAL_TABLET | ORAL | Status: DC

## 2015-01-31 NOTE — Telephone Encounter (Signed)
Pt would like a refill on Ambien and Lexapro, but she would like a qty of 60 of the Lexapro.

## 2015-01-31 NOTE — Telephone Encounter (Signed)
Medications refilled: Lexapro quantity + 60 w/ additional refills. Zolpidem 10 mg refills phoned to pharmacy.

## 2015-02-07 ENCOUNTER — Telehealth: Payer: Self-pay | Admitting: Family Medicine

## 2015-02-07 NOTE — Telephone Encounter (Signed)
Patient would like you to schedule the kidney ultrasound for the last week of June or the first week of July.  She would also like for you to turn her records over to Dr. Brigitte Pulse.

## 2015-02-11 ENCOUNTER — Other Ambulatory Visit: Payer: Self-pay | Admitting: Family Medicine

## 2015-02-11 DIAGNOSIS — N281 Cyst of kidney, acquired: Secondary | ICD-10-CM

## 2015-02-25 ENCOUNTER — Other Ambulatory Visit: Payer: Self-pay | Admitting: Urgent Care

## 2015-02-25 ENCOUNTER — Telehealth: Payer: Self-pay

## 2015-02-25 DIAGNOSIS — G4700 Insomnia, unspecified: Secondary | ICD-10-CM

## 2015-02-25 DIAGNOSIS — F419 Anxiety disorder, unspecified: Secondary | ICD-10-CM

## 2015-02-25 MED ORDER — ALPRAZOLAM 0.25 MG PO TABS: 0.2500 mg | ORAL_TABLET | Freq: Every evening | ORAL | Status: DC | PRN

## 2015-02-25 NOTE — Telephone Encounter (Signed)
Takes Xanax for insomnia and anxiety. Dr. Leward Quan had changed her rx from #60 to #40. She tries to use one nightly. She also takes Zolpidem 1/3 tablet every night. She has done this for 20 years to help with insomnia. She would like to get a script for #60 Xanax so that she does not have to come back for refills frequently. She does take Lexapro daily for maintenance therapy of her Xanax. I discussed potential for adverse effects and her increased risk given her age. These include delirium, dizziness, falls, nausea and vomiting to name a few. Patient verbalized understanding, denies any adverse effects as above with Zolpidem and Xanax use. She agreed to set up an appointment in the next couple of months with Dr. Brigitte Pulse. This is the doctor that she would like to see from now on since Dr. Leward Quan is no longer working. Patient will pick up script for Xanax soon.

## 2015-02-25 NOTE — Telephone Encounter (Signed)
Spoke with pt, she would like to change her quantity of her Xanax to #60 instead of #40. She states she takes one every night to help her sleep. She states she asked Dr. Leward Quan to change this befire she left but she end up changing the wrong one. Please advise.

## 2015-02-25 NOTE — Telephone Encounter (Signed)
Noted.  That's fine.

## 2015-02-27 ENCOUNTER — Telehealth: Payer: Self-pay | Admitting: Family Medicine

## 2015-02-27 NOTE — Telephone Encounter (Signed)
lmom to set an appt with Dr Brigitte Pulse for a follow up on medicine

## 2015-03-20 ENCOUNTER — Ambulatory Visit
Admission: RE | Admit: 2015-03-20 | Discharge: 2015-03-20 | Disposition: A | Discharge status: Home / Self Care | Payer: Medicare Other | Source: Ambulatory Visit | Attending: Family Medicine | Admitting: Family Medicine

## 2015-03-20 ENCOUNTER — Telehealth: Payer: Self-pay | Admitting: *Deleted

## 2015-03-20 DIAGNOSIS — N281 Cyst of kidney, acquired: Secondary | ICD-10-CM

## 2015-03-20 NOTE — Telephone Encounter (Signed)
Pt would like to know the results of the renal US.  Dr. Leward Quan ordered it and she wanted to make sure she get the results.

## 2015-03-20 NOTE — Telephone Encounter (Signed)
Relayed renal US results. Findings are the same as last year. 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 Repeat US in 1 year.

## 2015-04-19 ENCOUNTER — Encounter: Payer: Self-pay | Admitting: Family Medicine

## 2015-04-19 ENCOUNTER — Ambulatory Visit (INDEPENDENT_AMBULATORY_CARE_PROVIDER_SITE_OTHER): Payer: Medicare Other | Admitting: Family Medicine

## 2015-04-19 VITALS — BP 98/61 | HR 80 | Temp 98.5°F | Resp 16 | Ht 65.0 in | Wt 119.0 lb

## 2015-04-19 DIAGNOSIS — K5900 Constipation, unspecified: Secondary | ICD-10-CM | POA: Diagnosis not present

## 2015-04-19 DIAGNOSIS — D62 Acute posthemorrhagic anemia: Secondary | ICD-10-CM | POA: Diagnosis not present

## 2015-04-19 DIAGNOSIS — R312 Other microscopic hematuria: Secondary | ICD-10-CM

## 2015-04-19 DIAGNOSIS — M810 Age-related osteoporosis without current pathological fracture: Secondary | ICD-10-CM

## 2015-04-19 DIAGNOSIS — I4891 Unspecified atrial fibrillation: Secondary | ICD-10-CM

## 2015-04-19 DIAGNOSIS — F32A Depression, unspecified: Secondary | ICD-10-CM

## 2015-04-19 DIAGNOSIS — F418 Other specified anxiety disorders: Secondary | ICD-10-CM | POA: Diagnosis not present

## 2015-04-19 DIAGNOSIS — E7800 Pure hypercholesterolemia, unspecified: Secondary | ICD-10-CM

## 2015-04-19 DIAGNOSIS — G4700 Insomnia, unspecified: Secondary | ICD-10-CM

## 2015-04-19 DIAGNOSIS — R3129 Other microscopic hematuria: Secondary | ICD-10-CM

## 2015-04-19 DIAGNOSIS — Z23 Encounter for immunization: Secondary | ICD-10-CM

## 2015-04-19 DIAGNOSIS — F329 Major depressive disorder, single episode, unspecified: Secondary | ICD-10-CM

## 2015-04-19 DIAGNOSIS — E063 Autoimmune thyroiditis: Secondary | ICD-10-CM

## 2015-04-19 DIAGNOSIS — F419 Anxiety disorder, unspecified: Secondary | ICD-10-CM | POA: Diagnosis not present

## 2015-04-19 LAB — POCT UA - MICROSCOPIC ONLY
Bacteria, U Microscopic: NEGATIVE
CASTS, UR, LPF, POC: NEGATIVE
CRYSTALS, UR, HPF, POC: NEGATIVE
Epithelial cells, urine per micros: NEGATIVE
MUCUS UA: NEGATIVE
Yeast, UA: NEGATIVE

## 2015-04-19 LAB — POCT URINALYSIS DIPSTICK
Bilirubin, UA: NEGATIVE
GLUCOSE UA: NEGATIVE
Nitrite, UA: NEGATIVE
Protein, UA: NEGATIVE
Spec Grav, UA: 1.02
UROBILINOGEN UA: 0.2
pH, UA: 5

## 2015-04-19 MED ORDER — ESCITALOPRAM OXALATE 10 MG PO TABS: 10.0000 mg | ORAL_TABLET | Freq: Every day | ORAL | Status: DC

## 2015-04-19 MED ORDER — ZOLPIDEM TARTRATE 10 MG PO TABS: 5.0000 mg | ORAL_TABLET | Freq: Every evening | ORAL | Status: AC | PRN

## 2015-04-19 MED ORDER — ALPRAZOLAM 0.25 MG PO TABS: 0.2500 mg | ORAL_TABLET | Freq: Every evening | ORAL | Status: AC | PRN

## 2015-04-19 NOTE — Patient Instructions (Addendum)
I am worried about your osteoporosis.  Please discuss with Dr. Chalmers Cater.  At next visit, we should recheck your vitamin D unless she has done that.  Consider alternative ways of calcium supplementation and consider alternative medicines to the bisphosphonates.  Osteoporosis Throughout your life, your body breaks down old bone and replaces it with new bone. As you get older, your body does not replace bone as quickly as it breaks it down. By the age of 10 years, most people begin to gradually lose bone because of the imbalance between bone loss and replacement. Some people lose more bone than others. Bone loss beyond a specified normal degree is considered osteoporosis.  Osteoporosis affects the strength and durability of your bones. The inside of the ends of your bones and your flat bones, like the bones of your pelvis, look like honeycomb, filled with tiny open spaces. As bone loss occurs, your bones become less dense. This means that the open spaces inside your bones become bigger and the walls between these spaces become thinner. This makes your bones weaker. Bones of a person with osteoporosis can become so weak that they can break (fracture) during minor accidents, such as a simple fall. CAUSES  The following factors have been associated with the development of osteoporosis:  Smoking.  Drinking more than 2 alcoholic drinks several days per week.  Long-term use of certain medicines:  Corticosteroids.  Chemotherapy medicines.  Thyroid medicines.  Antiepileptic medicines.  Gonadal hormone suppression medicine.  Immunosuppression medicine.  Being underweight.  Lack of physical activity.  Lack of exposure to the sun. This can lead to vitamin D deficiency.  Certain medical conditions:  Certain inflammatory bowel diseases, such as Crohn disease and ulcerative colitis.  Diabetes.  Hyperthyroidism.  Hyperparathyroidism. RISK FACTORS Anyone can develop osteoporosis. However, the  following factors can increase your risk of developing osteoporosis:  Gender--Women are at higher risk than men.  Age--Being older than 50 years increases your risk.  Ethnicity--White and Asian people have an increased risk.  Weight --Being extremely underweight can increase your risk of osteoporosis.  Family history of osteoporosis--Having a family member who has developed osteoporosis can increase your risk. SYMPTOMS  Usually, people with osteoporosis have no symptoms.  DIAGNOSIS  Signs during a physical exam that may prompt your caregiver to suspect osteoporosis include:  Decreased height. This is usually caused by the compression of the bones that form your spine (vertebrae) because they have weakened and become fractured.  A curving or rounding of the upper back (kyphosis). To confirm signs of osteoporosis, your caregiver may request a procedure that uses 2 low-dose X-ray beams with different levels of energy to measure your bone mineral density (dual-energy X-ray absorptiometry [DXA]). Also, your caregiver may check your level of vitamin D. TREATMENT  The goal of osteoporosis treatment is to strengthen bones in order to decrease the risk of bone fractures. There are different types of medicines available to help achieve this goal. Some of these medicines work by slowing the processes of bone loss. Some medicines work by increasing bone density. Treatment also involves making sure that your levels of calcium and vitamin D are adequate. PREVENTION  There are things you can do to help prevent osteoporosis. Adequate intake of calcium and vitamin D can help you achieve optimal bone mineral density. Regular exercise can also help, especially resistance and weight-bearing activities. If you smoke, quitting smoking is an important part of osteoporosis prevention. MAKE SURE YOU:  Understand these instructions.  Will watch  your condition.  Will get help right away if you are not doing well  or get worse. FOR MORE INFORMATION www.osteo.org and EquipmentWeekly.com.ee Document Released: 06/10/2005 Document Revised: 12/26/2012 Document Reviewed: 08/15/2011 [facility] Patient Information 2015 [place], [place]. This information is not intended to replace advice given to you by your health care provider. Make sure you discuss any questions you have with your health care provider. Bone Health Our bones do many things. They provide structure, protect organs, anchor muscles, and store calcium. Adequate calcium in your diet and weight-bearing physical activity help build strong bones, improve bone amounts, and may reduce the risk of weakening of bones (osteoporosis) later in life. PEAK BONE MASS By age 82, the average woman has acquired most of her skeletal bone mass. A large decline occurs in older adults which increases the risk of osteoporosis. In women this occurs around the time of menopause. It is important for young girls to reach their peak bone mass in order to maintain bone health throughout life. A person with high bone mass as a young adult will be more likely to have a higher bone mass later in life. Not enough calcium consumption and physical activity early on could result in a failure to achieve optimum bone mass in adulthood. OSTEOPOROSIS Osteoporosis is a disease of the bones. It is defined as low bone mass with deterioration of bone structure. Osteoporosis leads to an increase risk of fractures with falls. These fractures commonly happen in the wrist, hip, and spine. While men and women of all ages and background can develop osteoporosis, some of the risk factors for osteoporosis are:  Female.  White.  Postmenopausal.  Older adults.  Small in body size.  Eating a diet low in calcium.  Physically inactive.  Smoking.  Use of some medications.  Family history. CALCIUM Calcium is a mineral needed by the body for healthy bones, teeth, and proper function of the heart, muscles, and  nerves. The body cannot produce calcium so it must be absorbed through food. Good sources of calcium include:  Dairy products (low fat or nonfat milk, cheese, and yogurt).  Dark green leafy vegetables (bok choy and broccoli).  Calcium fortified foods (orange juice, cereal, bread, soy beverages, and tofu products).  Nuts (almonds). Recommended amounts of calcium vary for individuals. RECOMMENDED CALCIUM INTAKES Age and Amount in mg per day  Children 1 to 3 years / 700 mg  Children 4 to 8 years / 1,000 mg  Children 9 to 13 years / 1,300 mg  Teens 14 to 18 years / 1,300 mg  Adults 19 to 50 years / 1,000 mg  Adult women 51 to 70 years / 1,200 mg  Adults 71 years and older / 1,200 mg  Pregnant and breastfeeding teens / 1,300 mg  Pregnant and breastfeeding adults / 1,000 mg Vitamin D also plays an important role in healthy bone development. Vitamin D helps in the absorption of calcium. WEIGHT-BEARING PHYSICAL ACTIVITY Regular physical activity has many positive health benefits. Benefits include strong bones. Weight-bearing physical activity early in life is important in reaching peak bone mass. Weight-bearing physical activities cause muscles and bones to work against gravity. Some examples of weight bearing physical activities include:  Walking, jogging, or running.  [employer].  Jumping rope.  Dancing.  Soccer.  Tennis or Racquetball.  Stair climbing.  Basketball.  Hiking.  Weight lifting.  Aerobic fitness classes. Including weight-bearing physical activity into an exercise plan is a great way to keep bones healthy. Adults:  Engage in at least 30 minutes of moderate physical activity on most, preferably all, days of the week. Children: Engage in at least 60 minutes of moderate physical activity on most, preferably all, days of the week. FOR MORE INFORMATION [place] Web designer, Soil scientist for [employer] and Promotion:  www.cnpp.usda.Jack: EquipmentWeekly.com.ee Document Released: 11/21/2003 Document Revised: 12/26/2012 Document Reviewed: 02/20/2009 [facility] Patient Information 2015 [place], [place]. This information is not intended to replace advice given to you by your health care provider. Make sure you discuss any questions you have with your health care provider. Calcium Intake Recommendations Calcium in our blood is important for the control of many things, such as:  Blood clotting.  Conducting of nerve impulses.  Muscle contraction.  Maintaining teeth and bone health.  Other body functions. Age group / Amount of calcium to consume daily, in milligrams (mg)  Birth to 6 months / 200 mg  Infants 7 to 12 months / 260 mg  Children 1 to 3 years / 700 mg  Children 4 to 8 years / 1,000 mg  Children 9 to 13 years / 1,300 mg  Teens 14 to 18 years / 1,300 mg  Adults 19 to 50 years / 1,000 mg  Adult women 51 to 70 years / 1,200 mg  Adults 71 years and older / 1,200 mg  Pregnant and breastfeeding teens / 1,300 mg  Pregnant and breastfeeding adults / 1,000 mg Document Released: 04/14/2004 Document Revised: 12/26/2012 Document Reviewed: 08/31/2005 [employer] Patient Information 2015 [place], [place]. This information is not intended to replace advice given to you by your health care provider. Make sure you discuss any questions you have with your health care provider.

## 2015-04-19 NOTE — Progress Notes (Signed)
Subjective:  This chart was scribed for Shawnee Knapp, MD by Tamsen Roers, at [facility].  This patient was seen in room 25 and the patient's care was started at 12:29 PM.    Patient ID: Mackenzie [name], female    DOB: 1946-07-27, 69 y.o.   MRN: [id number] Chief Complaint  Patient presents with  . Medication Management  . Allergic Reaction    HPI  HPI Comments: Mackenzie [name] is a 69 y.o. female who presents to the [facility] for a follow up regarding hematuria in the past as well as a refill for her anxiety medication. This July, patient had a kidney and bladder ultra sound ordered by Dr. 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  Patient never saw Dr. Gaynelle Arabian (urologist) which she was referred to.  Patient has had a bladder sling for the past 9 years or so.  She is up to date with her pneumonia and shingles vaccinations.  She is complaint with her Vitamin D and fish oil.  She states that Calcium gives her diarrhea so she does not take it.  Dr. Chalmers Cater (endorcrinologist- sees her 2 times a year) keeps track of her thyroid and she is currently taking Armour. She is no longer using Fosamax but is willing to talk to Dr. Chalmers Cater about not taking it anymore.  Patient has not rescheduled for her mammogram- and has never had any biopsies.  Patient denies any urinary symptoms.     Anxiety: She is requesting that she gets a Xanax refill for a longer period of time (currently has to get a refill every 2 months).  Patient has been taking Zolpidem for the past 20 years (does not take 1 whole pill every night and usually breaks it up.)   She takes xanax for her insomnia and states that it is the only thing that works for her.  She has a family history of insomnia (mother, brother and daughter) and states that she cannot function without getting proper sleep.   ----- She was last seen at [facility] 1 year  ago by my colleague Dr. Leward Quan.  She has chronic anxiety controlled with Xanax 1 mg BID. She takes Ambien 5 mg as needed for sleep.  She was instructed to get her shingles vaccine last year and is due for her Prevnar.  Patient has a history of macroscopic hematuria, she had a bladder suspension at [facility] around 2008 and had a renal ultrasound last year which showed simple kidney cysts. 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 She was referred to urology and scheduled to Dr. Gaynelle Arabian. Do not have any urology report in chart. She is on Lexapro 10 mg for her anxiety, exercises regularly.  Patient had an abnormal mammogram previously, last year on recheck, it was normal wen followed up with ultrasound. Initial screening study had showed calcifications that were not found on the diagnostic study. Patient is due for her next mammogram and has not yet scheduled it.  She had a normal vitamin D 3 years ago.  She was previously on Armour Thyroid 60 mgs, not prescribed here. She has been taking fish oil for her cholesterol.  Mackenzie [name]'s bone density scan 1 year ago showed a T square negative 2.8 in lumbar spine, so she does have osteoporosis.  She has not been on a bis phosphonate previously.  History of atrial fibrillation: Cardiology previously had on Metoprolol and Deltiazem. Has not seen cardiology in over 2 years.  Seen by Dr.Klein.  Patient had atrial fibrillation when she was preparing for her colonoscopy without any prior symtoms.  She was put on a vent recorder but did not have any events so she was taken off her aspirin and beta blocker.  She had a CHADS 2 score of 0.    -And history of Hashimoto but no endocrine evaluation in her chart.    Past Medical History  Diagnosis Date  . Arthritis   . Thyroid disease   . Anxiety   . Osteoporosis     per patient questionable  . Hashimoto's thyroiditis 10/12/2012  . Atrial fibrillation   . Depression    Current Outpatient Prescriptions  on File Prior to Visit  Medication Sig Dispense Refill  . ALPRAZolam (XANAX) 0.25 MG tablet Take 1 tablet (0.25 mg total) by mouth at bedtime as needed for anxiety or sleep. 60 tablet 0  . aspirin 81 MG chewable tablet Chew 1 tablet (81 mg total) by mouth daily.    . cholecalciferol (VITAMIN D) 1000 UNITS tablet Take 1,000 Units by mouth daily.    Marland Kitchen escitalopram (LEXAPRO) 10 MG tablet Take 1 tablet (10 mg total) by mouth daily. 60 tablet 2  . fish oil-omega-3 fatty acids 1000 MG capsule Take 2 capsules by mouth daily.    Marland Kitchen FLUZONE HIGH-DOSE 0.5 ML SUSY   0  . NON FORMULARY 4 (four) times a week.    Marland Kitchen OVER THE COUNTER MEDICATION OTC CQ 10 Ubiquinol taking    . OVER THE COUNTER MEDICATION Bee Propilis-Royal Jelly    . OVER THE COUNTER MEDICATION Vit D 3 taking    . polyethylene glycol (MIRALAX / GLYCOLAX) packet Take 17 g by mouth daily. Hold if develops diarrhea 14 each 0  . thyroid (ARMOUR) 60 MG tablet Take 60 mg by mouth daily.    Marland Kitchen zolpidem (AMBIEN) 10 MG tablet Take 1/2 tablet by mouth at bedtime if needed for sleep 30 tablet 1   No current facility-administered medications on file prior to visit.    Allergies  Allergen Reactions  . Clindamycin Hives  . Clonazepam Other (See Comments)    Nasal congestion.  . Vancomycin Hcl Other (See Comments)    Not sure which medication gave her whelps but this was administered with the clindamycin    Review of Systems  Constitutional: Negative for fever and chills.  Eyes: Negative for pain and redness.  Respiratory: Negative for cough, choking and shortness of breath.   Cardiovascular: Negative for chest pain.  Gastrointestinal: Negative for nausea and vomiting.  Genitourinary: Negative for dysuria, urgency, frequency and difficulty urinating.  Musculoskeletal: Negative for neck pain and neck stiffness.       Objective:   Physical Exam  Constitutional: She is oriented to person, place, and time. She appears well-developed and  well-nourished. No distress.  HENT:  Head: Normocephalic and atraumatic.  Eyes: Pupils are equal, round, and reactive to light.  Cardiovascular: Normal rate, regular rhythm and normal heart sounds.  Exam reveals no gallop and no friction rub.   No murmur heard. Pulmonary/Chest: Effort normal and breath sounds normal. No respiratory distress.  Lungs clear, good air movement.   Musculoskeletal: Normal range of motion.  Neurological: She is alert and oriented to person, place, and time.  Skin: Skin is warm and dry.  Psychiatric: She has a normal mood and affect. Her  behavior is normal.  Nursing note and vitals reviewed.  Filed Vitals:   04/19/15 1127  BP: 98/61  Pulse: 80  Temp: 98.5 F (36.9 C)  Resp: 16  Height: 5\' 5"  (1.651 m)  Weight: 119 lb (53.978 kg)       Assessment & Plan:   1. Need for prophylactic vaccination against Streptococcus pneumoniae (pneumococcus)   2. Osteoporosis - pt stopped fosamax due to concerns about femur fracture and dental problems - dexa 1 yr prev had T score -2.8 - reviewed Ca/Vit D recs. Pt encouraged to talk w/ Dr. Chalmers Cater about other osteoporosis trx options.  Repeat dexa in 1 yr.  3. Hashimoto's thyroiditis - follow by Dr. Chalmers Cater on Armour  4. Anxiety and depression   5. Elevated LDL cholesterol level   6. Insomnia - much improved on zolpidem 5 and xanax 0.25 every night - pt on zolpidem for decades and absolutely no interest in changing or trying to decrease to prn.  7. Acute blood loss anemia   8. Constipation, unspecified constipation type   9. Atrial fibrillation, unspecified - h/o during isolated colonoscopy prep only so pt refuses to ever do colonoscopy again.  Had full cardiology eval and no need to be anticoag or on rate control med as episode was provoked.  10. Anxiety - sxs sig improved on sched bid xanax - refilled. Recheck in 6 mos in OV for CPE and refills  11. Microscopic hematuria - long-standing.  Repeat renal US overall stable - a  new angiomylipoma seen on Lt kidney but I suspect this is of no clinical sig.  Pt rec to keep urology referral ths yr - likely needs cystoscopy to ensure this is benign and then she can put it behind her.    Orders Placed This Encounter  Procedures  . Urine culture  . Pneumococcal conjugate vaccine 13-valent IM  . Ambulatory referral to Urology    Referral Priority:  Routine    Referral Type:  Consultation    Referral Reason:  Specialty Services Required    Requested Specialty:  Urology    Number of Visits Requested:  1  . POCT UA - Microscopic Only  . POCT urinalysis dipstick    Meds ordered this encounter  Medications  . ALPRAZolam (XANAX) 0.25 MG tablet    Sig: Take 1 tablet (0.25 mg total) by mouth at bedtime as needed for anxiety or sleep.    Dispense:  60 tablet    Refill:  5  . escitalopram (LEXAPRO) 10 MG tablet    Sig: Take 1 tablet (10 mg total) by mouth daily.    Dispense:  90 tablet    Refill:  3  . zolpidem (AMBIEN) 10 MG tablet    Sig: Take 0.5 tablets (5 mg total) by mouth at bedtime as needed for sleep. Take 1/2 tablet by mouth at bedtime if needed for sleep    Dispense:  30 tablet    Refill:  5    I personally performed the services described in this documentation, which was scribed in my presence. The recorded information has been reviewed and considered, and addended by me as needed.  Delman Cheadle, MD MPH  Results for orders placed or performed in visit on 04/19/15  Urine culture  Result Value Ref Range   Colony Count 5,000 COLONIES/ML    Organism ID, Bacteria Insignificant Growth   POCT UA - Microscopic Only  Result Value Ref Range   WBC, Ur, HPF, POC 1-3  RBC, urine, microscopic 5-7    Bacteria, U Microscopic Negative    Mucus, UA Negative    Epithelial cells, urine per micros Negative    Crystals, Ur, HPF, POC Negative    Casts, Ur, LPF, POC Negative    Yeast, UA Negative   POCT urinalysis dipstick  Result Value Ref Range   Color, UA Light  Amber    Clarity, UA Clear    Glucose, UA negative    Bilirubin, UA Negative    Ketones, UA Trace    Spec Grav, UA 1.020    Blood, UA Moderate    pH, UA 5.0    Protein, UA Negative    Urobilinogen, UA 0.2    Nitrite, UA Negative    Leukocytes, UA Trace (A) Negative

## 2015-04-20 LAB — URINE CULTURE: Colony Count: 5000

## 2015-04-22 ENCOUNTER — Other Ambulatory Visit: Payer: Self-pay

## 2015-04-22 DIAGNOSIS — Z1231 Encounter for screening mammogram for malignant neoplasm of breast: Secondary | ICD-10-CM

## 2015-05-15 ENCOUNTER — Telehealth: Payer: Self-pay

## 2015-05-15 MED ORDER — ESCITALOPRAM OXALATE 20 MG PO TABS: 20.0000 mg | ORAL_TABLET | Freq: Every day | ORAL | Status: DC

## 2015-05-15 NOTE — Telephone Encounter (Signed)
Yes, is on 10 so the 20mg  lexapro sent to her pharmacy.  Can take 2 tabs/d of her current 10 until it runs out, then start new.

## 2015-05-15 NOTE — Telephone Encounter (Signed)
Spoke with pt, advised message from Dr. Brigitte Pulse.

## 2015-05-15 NOTE — Telephone Encounter (Signed)
Dr. Brigitte Pulse,  Pt would like to know if she can move up to the higher dose of Lexapro. Please call: [phone]

## 2015-05-30 ENCOUNTER — Ambulatory Visit
Admission: RE | Admit: 2015-05-30 | Discharge: 2015-05-30 | Disposition: A | Discharge status: Home / Self Care | Payer: Medicare Other | Source: Ambulatory Visit

## 2015-05-30 DIAGNOSIS — Z1231 Encounter for screening mammogram for malignant neoplasm of breast: Secondary | ICD-10-CM

## 2015-05-31 ENCOUNTER — Encounter (HOSPITAL_COMMUNITY): Payer: Self-pay | Admitting: Emergency Medicine

## 2015-05-31 ENCOUNTER — Emergency Department (HOSPITAL_COMMUNITY)
Admission: EM | Admit: 2015-05-31 | Discharge: 2015-05-31 | Disposition: A | Discharge status: Home / Self Care | Payer: Medicare Other | Attending: Emergency Medicine | Admitting: Emergency Medicine

## 2015-05-31 DIAGNOSIS — E079 Disorder of thyroid, unspecified: Secondary | ICD-10-CM | POA: Diagnosis not present

## 2015-05-31 DIAGNOSIS — R05 Cough: Secondary | ICD-10-CM | POA: Diagnosis present

## 2015-05-31 DIAGNOSIS — J159 Unspecified bacterial pneumonia: Secondary | ICD-10-CM | POA: Diagnosis not present

## 2015-05-31 DIAGNOSIS — F329 Major depressive disorder, single episode, unspecified: Secondary | ICD-10-CM | POA: Diagnosis not present

## 2015-05-31 DIAGNOSIS — Z8679 Personal history of other diseases of the circulatory system: Secondary | ICD-10-CM | POA: Insufficient documentation

## 2015-05-31 DIAGNOSIS — F419 Anxiety disorder, unspecified: Secondary | ICD-10-CM | POA: Diagnosis not present

## 2015-05-31 DIAGNOSIS — M810 Age-related osteoporosis without current pathological fracture: Secondary | ICD-10-CM | POA: Insufficient documentation

## 2015-05-31 DIAGNOSIS — Z7982 Long term (current) use of aspirin: Secondary | ICD-10-CM | POA: Diagnosis not present

## 2015-05-31 DIAGNOSIS — Z79899 Other long term (current) drug therapy: Secondary | ICD-10-CM | POA: Insufficient documentation

## 2015-05-31 DIAGNOSIS — J189 Pneumonia, unspecified organism: Secondary | ICD-10-CM

## 2015-05-31 DIAGNOSIS — M1990 Unspecified osteoarthritis, unspecified site: Secondary | ICD-10-CM | POA: Insufficient documentation

## 2015-05-31 MED ORDER — LEVOFLOXACIN 750 MG PO TABS: 750.0000 mg | ORAL_TABLET | Freq: Every day | ORAL | Status: DC

## 2015-05-31 MED ORDER — HYDROCODONE-HOMATROPINE 5-1.5 MG/5ML PO SYRP: 5.0000 mL | ORAL_SOLUTION | Freq: Four times a day (QID) | ORAL | Status: DC | PRN

## 2015-05-31 NOTE — ED Notes (Signed)
AVS explained in detail, knows to follow up with PCP -has appt next week. Knows to take entire abx regimen/drink fluids/rest. No other c/c.

## 2015-05-31 NOTE — ED Provider Notes (Signed)
CSN: [id number]     Arrival date & time 05/31/15  2018 History  This chart was scribed for non-physician practitioner, Alvina Chou, PA-C working with Noemi Chapel, MD by Hansel Feinstein, ED scribe. This patient was seen in room WTR6/WTR6 and the patient's care was started at 9:16 PM    Chief Complaint  Patient presents with  . Sore Throat  . Cough   The history is provided by the patient. No language interpreter was used.    HPI Comments: Mackenzie [name] is a 69 y.o. female who presents to the Emergency Department complaining of moderate, intermittent dry cough onset 2.5 weeks ago with associated subjective fever, sore throat. She states that her symptoms began with the sore throat and progressed with the cough and fever. Pt notes that she feels that there is phlegm in her chest but is not coughing any up. She notes that she has had 2 pneumonia shots this year, with her last shot 2 months ago. She states she went to Urgent Care earlier today but it was closed. She states she had a CT Abd/pelv at [facility] and a mammogram this week and is not requesting CXR at this time. She denies any other symptoms.   Past Medical History  Diagnosis Date  . Arthritis   . Thyroid disease   . Anxiety   . Osteoporosis     per patient questionable  . Hashimoto's thyroiditis 10/12/2012  . Atrial fibrillation   . Depression    Past Surgical History  Procedure Laterality Date  . Cosmetic surgery    . Abdominal hysterectomy    . Bladder prolapse surgery   Age 38 or 50.    [employer] in [place].  . Wrist surgery    . Colonoscopy N/A 11/23/2012    Procedure: COLONOSCOPY;  Surgeon: Winfield Cunas., MD;  Location: [facility];  Service: Endoscopy;  Laterality: N/A;  . Joint replacement     Family History  Problem Relation Age of Onset  . COPD Mother     emphysema  . Cancer Father   . Alcohol abuse Brother    Social History  Substance Use Topics  . Smoking status: Never Smoker   .  Smokeless tobacco: Never Used  . Alcohol Use: No   OB History    No data available     Review of Systems  Constitutional: Positive for fever ( subjective).  HENT: Positive for sore throat.   Respiratory: Positive for cough.   All other systems reviewed and are negative.  Allergies  Clindamycin; Clonazepam; and Vancomycin hcl  Home Medications   Prior to Admission medications   Medication Sig Start Date End Date Taking? Authorizing Treyshon Buchanon  ALPRAZolam (XANAX) 0.25 MG tablet Take 1 tablet (0.25 mg total) by mouth at bedtime as needed for anxiety or sleep. 04/19/15   Shawnee Knapp, MD  aspirin 81 MG chewable tablet Chew 1 tablet (81 mg total) by mouth daily. 11/24/12   Eugenie Filler, MD  cholecalciferol (VITAMIN D) 1000 UNITS tablet Take 1,000 Units by mouth daily.    Historical Yasaman Kolek, MD  escitalopram (LEXAPRO) 20 MG tablet Take 1 tablet (20 mg total) by mouth daily. 05/15/15   Shawnee Knapp, MD  fish oil-omega-3 fatty acids 1000 MG capsule Take 2 capsules by mouth daily.    Historical Natash Berman, MD  FLUZONE HIGH-DOSE 0.5 ML SUSY  07/04/14   Historical Verlin Uher, MD  NON FORMULARY 4 (four) times a week.    Historical Elena Davia,  MD  OVER THE COUNTER MEDICATION OTC CQ 10 Ubiquinol taking    Historical Ronnica Dreese, MD  OVER THE COUNTER MEDICATION Bee Propilis-Royal Jelly    Historical Wilmar Prabhakar, MD  OVER THE COUNTER MEDICATION Vit D 3 taking    Historical Sedale Jenifer, MD  polyethylene glycol (MIRALAX / GLYCOLAX) packet Take 17 g by mouth daily. Hold if develops diarrhea 11/24/12   Eugenie Filler, MD  thyroid (ARMOUR) 60 MG tablet Take 60 mg by mouth daily.    Historical Jasara Corrigan, MD  zolpidem (AMBIEN) 10 MG tablet Take 0.5 tablets (5 mg total) by mouth at bedtime as needed for sleep. Take 1/2 tablet by mouth at bedtime if needed for sleep 04/19/15   Shawnee Knapp, MD   BP 117/59 mmHg  Pulse 91  Temp(Src) 100.4 F (38 C) (Oral)  Resp 16  SpO2 100% Physical Exam  Constitutional: She is oriented to  person, place, and time. She appears well-developed and well-nourished. No distress.  HENT:  Head: Normocephalic and atraumatic.  Eyes: Conjunctivae and EOM are normal. Pupils are equal, round, and reactive to light.  Neck: Normal range of motion. Neck supple.  Cardiovascular: Normal rate, regular rhythm and normal heart sounds.  Exam reveals no gallop and no friction rub.   No murmur heard. Pulmonary/Chest: Effort normal and breath sounds normal. No respiratory distress. She has no wheezes. She has no rales. She exhibits no tenderness.  Lungs CTA bilaterally.   Abdominal: Soft. She exhibits no distension. There is no tenderness.  Musculoskeletal: Normal range of motion.  Neurological: She is alert and oriented to person, place, and time.  Speech is goal-oriented. Moves limbs without ataxia.   Skin: Skin is warm and dry.  Psychiatric: She has a normal mood and affect. Her behavior is normal.  Nursing note and vitals reviewed.  ED Course  Procedures (including critical care time) DIAGNOSTIC STUDIES: Oxygen Saturation is 100% on RA, normal by my interpretation.    COORDINATION OF CARE: 9:22 PM Discussed treatment plan with pt at bedside and pt agreed to plan.   Labs Review Labs Reviewed - No data to display  Imaging Review Mm Screening Breast Tomo Bilateral  05/30/2015   CLINICAL DATA:  Screening.  EXAM: DIGITAL SCREENING BILATERAL MAMMOGRAM WITH 3D TOMO WITH CAD  COMPARISON:  Previous exam(s).  ACR Breast Density Category c: The breast tissue is heterogeneously dense, which may obscure small masses.  FINDINGS: There are no dominant masses, suspicious calcifications or secondary signs of malignancy identified in either breast. Again noted are scattered benign-appearing calcifications within each breast. Images were processed with CAD.  IMPRESSION: No mammographic evidence of malignancy. A result letter of this screening mammogram will be mailed directly to the patient.  RECOMMENDATION:  Screening mammogram in one year. (Code:SM-B-01Y)  BI-RADS CATEGORY  2: Benign.   Electronically Signed   By: Franki Cabot M.D.   On: 05/30/2015 13:17   I have personally reviewed and evaluated these images and lab results as part of my medical decision-making.   EKG Interpretation None      MDM   Final diagnoses:  CAP (community acquired pneumonia)   9:29 PM Patient refuses chest xray here. Patient will be treated for CAP with levaquin based on symptoms and fever here. Patient will have hycodan for cough. Patient instructed to return with worsening or concerning symptoms. Remaining vitals stable.   I personally performed the services described in this documentation, which was scribed in my presence. The recorded information has been reviewed  and is accurate.   Alvina Chou, PA-C 05/31/15 2130  Noemi Chapel, MD 06/01/15 1700

## 2015-05-31 NOTE — ED Notes (Addendum)
Pt c/o cough x 2.5 weeks. Denies productive sputum/chest pain/SOB. Also has been hoarse for the same amount of time. Denies fever/N/V/D/other symptoms. No other c/c. Lungs clear to auscultation. No wheezing noted. Reports having pneumonia immunization. Pt has had CT scan and another x-ray completed this week. Does not wish to have more radiation this week.

## 2015-05-31 NOTE — Discharge Instructions (Signed)
Take Levaquin as directed until gone. Take hycodan as needed for cough. Refer to attached documents for more information.

## 2015-06-06 ENCOUNTER — Ambulatory Visit (INDEPENDENT_AMBULATORY_CARE_PROVIDER_SITE_OTHER): Payer: Medicare Other | Admitting: Family Medicine

## 2015-06-06 ENCOUNTER — Encounter: Payer: Self-pay | Admitting: Family Medicine

## 2015-06-06 VITALS — BP 90/54 | HR 70 | Temp 98.4°F | Resp 16 | Ht 65.5 in | Wt 117.8 lb

## 2015-06-06 DIAGNOSIS — I4891 Unspecified atrial fibrillation: Secondary | ICD-10-CM | POA: Diagnosis not present

## 2015-06-06 DIAGNOSIS — J189 Pneumonia, unspecified organism: Secondary | ICD-10-CM | POA: Diagnosis not present

## 2015-06-06 DIAGNOSIS — M810 Age-related osteoporosis without current pathological fracture: Secondary | ICD-10-CM

## 2015-06-06 DIAGNOSIS — F418 Other specified anxiety disorders: Secondary | ICD-10-CM

## 2015-06-06 DIAGNOSIS — Z0000 Encounter for general adult medical examination without abnormal findings: Secondary | ICD-10-CM

## 2015-06-06 DIAGNOSIS — R3129 Other microscopic hematuria: Secondary | ICD-10-CM

## 2015-06-06 DIAGNOSIS — R312 Other microscopic hematuria: Secondary | ICD-10-CM | POA: Diagnosis not present

## 2015-06-06 DIAGNOSIS — R197 Diarrhea, unspecified: Secondary | ICD-10-CM

## 2015-06-06 DIAGNOSIS — E063 Autoimmune thyroiditis: Secondary | ICD-10-CM | POA: Diagnosis not present

## 2015-06-06 DIAGNOSIS — E7800 Pure hypercholesterolemia, unspecified: Secondary | ICD-10-CM

## 2015-06-06 DIAGNOSIS — F329 Major depressive disorder, single episode, unspecified: Secondary | ICD-10-CM

## 2015-06-06 DIAGNOSIS — F419 Anxiety disorder, unspecified: Secondary | ICD-10-CM

## 2015-06-06 LAB — POC MICROSCOPIC URINALYSIS (UMFC)

## 2015-06-06 LAB — COMPREHENSIVE METABOLIC PANEL
ALBUMIN: 4.1 g/dL (ref 3.6–5.1)
ALT: 6 U/L (ref 6–29)
AST: 17 U/L (ref 10–35)
Alkaline Phosphatase: 50 U/L (ref 33–130)
BUN: 13 mg/dL (ref 7–25)
CHLORIDE: 102 mmol/L (ref 98–110)
CO2: 27 mmol/L (ref 20–31)
Calcium: 9.2 mg/dL (ref 8.6–10.4)
Creat: 0.74 mg/dL (ref 0.50–0.99)
Glucose, Bld: 89 mg/dL (ref 65–99)
POTASSIUM: 4.2 mmol/L (ref 3.5–5.3)
Sodium: 139 mmol/L (ref 135–146)
Total Bilirubin: 0.7 mg/dL (ref 0.2–1.2)
Total Protein: 6.6 g/dL (ref 6.1–8.1)

## 2015-06-06 LAB — LIPID PANEL
CHOL/HDL RATIO: 3.1 ratio (ref <=5.0)
Cholesterol: 174 mg/dL (ref 125–200)
HDL: 57 mg/dL (ref >=46)
LDL Cholesterol: 100 mg/dL (ref <130)
TRIGLYCERIDES: 83 mg/dL (ref <150)
VLDL: 17 mg/dL (ref <30)

## 2015-06-06 LAB — POCT URINALYSIS DIP (MANUAL ENTRY)
Bilirubin, UA: NEGATIVE
GLUCOSE UA: NEGATIVE
Ketones, POC UA: NEGATIVE
LEUKOCYTES UA: NEGATIVE
Nitrite, UA: NEGATIVE
PROTEIN UA: NEGATIVE
Spec Grav, UA: <=1.005
UROBILINOGEN UA: 0.2
pH, UA: 6

## 2015-06-06 NOTE — Progress Notes (Signed)
Subjective:    Patient ID: Mackenzie [name], female    DOB: Jul 14, 1946, 69 y.o.   MRN: [id number]  Chief Complaint  Patient presents with  . Annual Exam    no pap    HPI   This 69 y.o. Cauc female presents for [facility] Subsequent CPE and labs. She was a patient of my colleague Dr. Leward Quan.  HCM: PAP- NA (s/p TAH-BSO for benign reason). Neg pap 02/2012           MMG- Current; done July 2015. Pt has very dense breasts and so always gets a call-back in for repeat which causes severe anxiety. She had her mam done last wk which was benign - recheck in 1 yr           DEXA-followed by Dr. Chalmers Cater (to be scheduled w/ [facility]).  Taking vit D daily but not calcium (causes diarrhea). Started reclast yesterday.           IMM- Zostavax done last yr, pneumvax/prevnar done. TDaP done 2012.           CRS- Current on 11/2012 with diverticulosis and GI bleeding due to h/o ischemic colitis. Had polpys on colonoscopy in 2010 but none in 2014 so rec to repeat in 5 years -> 1749 ; pt had complications requiring hospitalization and she states she will not                      undergo that procedure again. Saw Dr. Laurence Spates for this. In 11/23/2012?           Vision- Current; wears corrective lenses. Started worsening a little - has appt next mo.  STI screening: neg hep C last yr  Physicians/Health care providers Derm: [facility] PA-C Dentist: has been several years Optho: [facility] GI: Dr. Irine Seal (?) Podiatrist: Dr. Rojelio Brenner Cardiologist: Dr. Caryl Comes  Endocrinologist: Dr. Chalmers Cater Urologist Dr. Kellie Simmering  Hashimoto's thyroiditis: dev on 2014. on Armour thyroid 60 followed every 6 mos by Dr. Chalmers Cater Osteoporosis: t-score -2.8 at [facility] 2015.  was on Fosamax followed by Dr. Chalmers Cater but pt went off due to concerns about side effects. nml vit D 3 yrs ago.bid  Taking ca/vit D.  SDr. Chalmers Cater started pt on Reclast - first dose yesterday. A. Fib: was on Metoprolol  12.5, diltiazem, and asa 81. Only occurred during  colonscopy prep with no signs of any recurrence prior so gradually taken off rate control meds as CHADS2 was 0. Had full cardiology eval and no need to be anticoag or on rate control med as episode was provoked HPL: on fish oil Mood d/o: She has anxiety esp w/ reguard to her health. She was started on Lexapro 10 mg daily over a yr ago with good result intially and increaed to 20mg  3 wks prior which is already helping. [facility] and this is very helpful for improved mood.  Insomnia: She uses xanax 0.25mg  and ambien 1/3 every night for sleep and has also been using xanax as needed 1-2x/d for the past yr. 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 UA was repeated several times and plan was to repeat US in 6-12 mos but pt becomes very anxious about any potential medical prob so  she was referred to urology Dr. Gaynelle Arabian but did not keep that appt. 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  She did have a bladder sling placed around [facility] at [facility].  I encouraged pt to go ahead with urology referral which she did and they did at CT scan with Dr. Kellie Simmering as well as a cystoscopy which was normal and unknown cause of hematuria so follow-up in 1 yr but nothing concerning.  Has had intermittent episodes of diarrhea alternating with constipation resolved for the past week.  Does have abdominal cramping prior to bowel movements.  Did start probiotics recently.  Diagnosed w/ PNA at ER visit 1 wk ago by sxs (fever and cough x 2-3 wks). Pt placed on Levaquin w/ prn hydocan but refused CXR. Still coughing but low grade fever and non-productive asnd still horse.  Married Hearing: no concerns. Memory: unchanged Fall risk: none Safety at Home: doesn't use upstairs; does have shower  seat.    Review of Systems  Genitourinary: Positive for hematuria.  Musculoskeletal: Positive for arthralgias. Negative for gait problem.  All other systems reviewed and are negative.     Objective:  BP 90/54 mmHg  Pulse 70  Temp(Src) 98.4 F (36.9 C) (Oral)  Resp 16  Ht 5' 5.5" (1.664 m)  Wt 117 lb 12.8 oz (53.434 kg)  BMI 19.30 kg/m2  Physical Exam  Constitutional: She is oriented to person, place, and time. She appears well-developed and well-nourished. No distress.  HENT:  Head: Normocephalic and atraumatic.  Right Ear: Tympanic membrane, external ear and ear canal normal.  Left Ear: Tympanic membrane, external ear and ear canal normal.  Nose: Nose normal. No mucosal edema or rhinorrhea.  Mouth/Throat: Uvula is midline, oropharynx is clear and moist and mucous membranes are normal. No posterior oropharyngeal erythema.  Eyes: Conjunctivae and EOM are normal. Pupils are equal, round, and reactive to light. Right eye exhibits no discharge. Left eye exhibits no discharge. No scleral icterus.  Neck: Normal range of motion. Neck supple. No thyromegaly present.  Cardiovascular: Normal rate, regular rhythm, normal heart sounds and intact distal pulses.   Pulmonary/Chest: Effort normal and breath sounds normal. No respiratory distress.  Abdominal: Soft. Bowel sounds are normal. There is no tenderness.  Musculoskeletal: She exhibits no edema.  Lymphadenopathy:    She has no cervical adenopathy.  Neurological: She is alert and oriented to person, place, and time. She has normal reflexes.  Skin: Skin is warm and dry. She is not diaphoretic. No erythema.  Psychiatric: She has a normal mood and affect. Her behavior is normal.          Assessment & Plan:   1. Medicare annual wellness visit, subsequent - neg hep C ab last yr.    2. Elevated LDL cholesterol level  - Currently taking Omega-3 Fish Oil.    3. Osteoporosis - Had DEXA bone scan ordered last yr and was prev on fosamax by  Dr. Chalmers Cater - don't know if pt has had her vit D level checked.  Check vit D level unless another provider has in the past yr.  4. Hashimoto's thyroiditis   5. Microscopic hematuria - Painless hematuria with cysts and angiomylipomas- kept urology referral and had CT scan done  6. Atrial fibrillation, unspecified   7. Anxiety and depression   8. CAP (community acquired pneumonia)   59. Diarrhea     Orders Placed This Encounter  Procedures  .  Lipid panel    Order Specific Question:  Has the patient fasted?    Answer:  Yes  . Comprehensive metabolic panel    Order Specific Question:  Has the patient fasted?    Answer:  Yes  . POCT urinalysis dipstick  . POCT Microscopic Urinalysis ([facility])  . POCT CBC    Delman Cheadle, MD MPH  Results for orders placed or performed in visit on 06/06/15  Lipid panel  Result Value Ref Range   Cholesterol 174 125 - 200 mg/dL   Triglycerides 83 <150 mg/dL   HDL 57 >=46 mg/dL   Total CHOL/HDL Ratio 3.1 <=5.0 Ratio   VLDL 17 <30 mg/dL   LDL Cholesterol 100 <130 mg/dL  Comprehensive metabolic panel  Result Value Ref Range   Sodium 139 135 - 146 mmol/L   Potassium 4.2 3.5 - 5.3 mmol/L   Chloride 102 98 - 110 mmol/L   CO2 27 20 - 31 mmol/L   Glucose, Bld 89 65 - 99 mg/dL   BUN 13 7 - 25 mg/dL   Creat 0.74 0.50 - 0.99 mg/dL   Total Bilirubin 0.7 0.2 - 1.2 mg/dL   Alkaline Phosphatase 50 33 - 130 U/L   AST 17 10 - 35 U/L   ALT 6 6 - 29 U/L   Total Protein 6.6 6.1 - 8.1 g/dL   Albumin 4.1 3.6 - 5.1 g/dL   Calcium 9.2 8.6 - 10.4 mg/dL  POCT urinalysis dipstick  Result Value Ref Range   Color, UA yellow yellow   Clarity, UA clear clear   Glucose, UA negative negative   Bilirubin, UA negative negative   Ketones, POC UA negative negative   Spec Grav, UA <=1.005    Blood, UA trace-intact (A) negative   pH, UA 6.0    Protein Ur, POC negative negative   Urobilinogen, UA 0.2    Nitrite, UA Negative Negative   Leukocytes, UA Negative Negative  POCT  Microscopic Urinalysis ([facility])  Result Value Ref Range   WBC,UR,HPF,POC Few (A) None WBC/hpf   RBC,UR,HPF,POC Few (A) None RBC/hpf   Bacteria None None   Mucus Present (A) Absent   Epithelial Cells, UR Per Microscopy None None cells/hpf

## 2015-06-06 NOTE — Patient Instructions (Addendum)
Health Recommendations for Postmenopausal Women Respected and ongoing research has looked at the most common causes of death, disability, and poor quality of life in postmenopausal women. The causes include heart disease, diseases of blood vessels, diabetes, depression, cancer, and bone loss (osteoporosis). Many things can be done to help lower the chances of developing these and other common problems. CARDIOVASCULAR DISEASE Heart Disease: A heart attack is a medical emergency. Know the signs and symptoms of a heart attack. Below are things women can do to reduce their risk for heart disease.   Do not smoke. If you smoke, quit.  Aim for a healthy weight. Being overweight causes many preventable deaths. Eat a healthy and balanced diet and drink an adequate amount of liquids.  Get moving. Make a commitment to be more physically active. Aim for 30 minutes of activity on most, if not all days of the week.  Eat for heart health. Choose a diet that is low in saturated fat and cholesterol and eliminate trans fat. Include whole grains, vegetables, and fruits. Read and understand the labels on food containers before buying.  Know your numbers. Ask your caregiver to check your blood pressure, cholesterol (total, HDL, LDL, triglycerides) and blood glucose. Work with your caregiver on improving your entire clinical picture.  High blood pressure. Limit or stop your table salt intake (try salt substitute and food seasonings). Avoid salty foods and drinks. Read labels on food containers before buying. Eating well and exercising can help control high blood pressure. STROKE  Stroke is a medical emergency. Stroke may be the result of a blood clot in a blood vessel in the brain or by a brain hemorrhage (bleeding). Know the signs and symptoms of a stroke. To lower the risk of developing a stroke:  Avoid fatty foods.  Quit smoking.  Control your diabetes, blood pressure, and irregular heart  rate. THROMBOPHLEBITIS (BLOOD CLOT) OF THE LEG  Becoming overweight and leading a stationary lifestyle may also contribute to developing blood clots. Controlling your diet and exercising will help lower the risk of developing blood clots. CANCER SCREENING  Breast Cancer: Take steps to reduce your risk of breast cancer.  You should practice "breast self-awareness." This means understanding the normal appearance and feel of your breasts and should include breast self-examination. Any changes detected, no matter how small, should be reported to your caregiver.  After age 23, you should have a clinical breast exam (CBE) every year.  Starting at age 70, you should consider having a mammogram (breast X-ray) every year.  If you have a family history of breast cancer, talk to your caregiver about genetic screening.  If you are at high risk for breast cancer, talk to your caregiver about having an MRI and a mammogram every year.  Intestinal or Stomach Cancer: Tests to consider are a rectal exam, fecal occult blood, sigmoidoscopy, and colonoscopy. Women who are high risk may need to be screened at an earlier age and more often.  Cervical Cancer:  Beginning at age 48, you should have a Pap test every 3 years as long as the past 3 Pap tests have been normal.  If you have had past treatment for cervical cancer or a condition that could lead to cancer, you need Pap tests and screening for cancer for at least 20 years after your treatment.  If you had a hysterectomy for a problem that was not cancer or a condition that could lead to cancer, then you no longer need Pap tests.  If you are between ages 65 and 70, and you have had normal Pap tests going back 10 years, you no longer need Pap tests.  If Pap tests have been discontinued, risk factors (such as a new sexual partner) need to be reassessed to determine if screening should be resumed.  Some medical problems can increase the chance of getting  cervical cancer. In these cases, your caregiver may recommend more frequent screening and Pap tests.  Uterine Cancer: If you have vaginal bleeding after reaching menopause, you should notify your caregiver.  Ovarian Cancer: Other than yearly pelvic exams, there are no reliable tests available to screen for ovarian cancer at this time except for yearly pelvic exams.  Lung Cancer: Yearly chest X-rays can detect lung cancer and should be done on high risk women, such as cigarette smokers and women with chronic lung disease (emphysema).  Skin Cancer: A complete body skin exam should be done at your yearly examination. Avoid overexposure to the sun and ultraviolet light lamps. Use a strong sun block cream when in the sun. All of these things are important for lowering the risk of skin cancer. MENOPAUSE Menopause Symptoms: Hormone therapy products are effective for treating symptoms associated with menopause:  Moderate to severe hot flashes.  Night sweats.  Mood swings.  Headaches.  Tiredness.  Loss of sex drive.  Insomnia.  Other symptoms. Hormone replacement carries certain risks, especially in older women. Women who use or are thinking about using estrogen or estrogen with progestin treatments should discuss that with their caregiver. Your caregiver will help you understand the benefits and risks. The ideal dose of hormone replacement therapy is not known. The Food and Drug Administration (FDA) has concluded that hormone therapy should be used only at the lowest doses and for the shortest amount of time to reach treatment goals.  OSTEOPOROSIS Protecting Against Bone Loss and Preventing Fracture If you use hormone therapy for prevention of bone loss (osteoporosis), the risks for bone loss must outweigh the risk of the therapy. Ask your caregiver about other medications known to be safe and effective for preventing bone loss and fractures. To guard against bone loss or fractures, the  following is recommended:  If you are younger than age 50, take 1000 mg of calcium and at least 600 mg of Vitamin D per day.  If you are older than age 50 but younger than age 70, take 1200 mg of calcium and at least 600 mg of Vitamin D per day.  If you are older than age 70, take 1200 mg of calcium and at least 800 mg of Vitamin D per day. Smoking and excessive alcohol intake increases the risk of osteoporosis. Eat foods rich in calcium and vitamin D and do weight bearing exercises several times a week as your caregiver suggests. DIABETES Diabetes Mellitus: If you have type I or type 2 diabetes, you should keep your blood sugar under control with diet, exercise, and recommended medication. Avoid starchy and fatty foods, and too many sweets. Being overweight can make diabetes control more difficult. COGNITION AND MEMORY Cognition and Memory: Menopausal hormone therapy is not recommended for the prevention of cognitive disorders such as Alzheimer's disease or memory loss.  DEPRESSION  Depression may occur at any age, but it is common in elderly women. This may be because of physical, medical, social (loneliness), or financial problems and needs. If you are experiencing depression because of medical problems and control of symptoms, talk to your caregiver about this. Physical   activity and exercise may help with mood and sleep. Community and volunteer involvement may improve your sense of value and worth. If you have depression and you feel that the problem is getting worse or becoming severe, talk to your caregiver about which treatment options are best for you. ACCIDENTS  Accidents are common and can be serious in elderly woman. Prepare your house to prevent accidents. Eliminate throw rugs, place hand bars in bath, shower, and toilet areas. Avoid wearing high heeled shoes or walking on wet, snowy, and icy areas. Limit or stop driving if you have vision or hearing problems, or if you feel you are  unsteady with your movements and reflexes. HEPATITIS C Hepatitis C is a type of viral infection affecting the liver. It is spread mainly through contact with blood from an infected person. It can be treated, but if left untreated, it can lead to severe liver damage over the years. Many people who are infected do not know that the virus is in their blood. If you are a "baby-boomer", it is recommended that you have one screening test for Hepatitis C. IMMUNIZATIONS  Several immunizations are important to consider having during your senior years, including:   Tetanus, diphtheria, and pertussis booster shot.  Influenza every year before the flu season begins.  Pneumonia vaccine.  Shingles vaccine.  Others, as indicated based on your specific needs. Talk to your caregiver about these. Document Released: 10/23/2005 Document Revised: 01/15/2014 Document Reviewed: 06/18/2008 [facility] Patient Information 2015 [place], [place]. This information is not intended to replace advice given to you by your health care provider. Make sure you discuss any questions you have with your health care provider.  Hematuria Hematuria is blood in your urine. It can be caused by a bladder infection, kidney infection, prostate infection, kidney stone, or cancer of your urinary tract. Infections can usually be treated with medicine, and a kidney stone usually will pass through your urine. If neither of these is the cause of your hematuria, further workup to find out the reason may be needed. It is very important that you tell your health care provider about any blood you see in your urine, even if the blood stops without treatment or happens without causing pain. Blood in your urine that happens and then stops and then happens again can be a symptom of a very serious condition. Also, pain is not a symptom in the initial stages of many urinary cancers. HOME CARE INSTRUCTIONS   Drink lots of fluid, 3-4 quarts a day. If you have  been diagnosed with an infection, cranberry juice is especially recommended, in addition to large amounts of water.  Avoid caffeine, tea, and carbonated beverages because they tend to irritate the bladder.  Avoid alcohol because it may irritate the prostate.  Take all medicines as directed by your health care provider.  If you were prescribed an antibiotic medicine, finish it all even if you start to feel better.  If you have been diagnosed with a kidney stone, follow your health care provider's instructions regarding straining your urine to catch the stone.  Empty your bladder often. Avoid holding urine for long periods of time.  After a bowel movement, women should cleanse front to back. Use each tissue only once.  Empty your bladder before and after sexual intercourse if you are a female. SEEK MEDICAL CARE IF:  You develop back pain.  You have a fever.  You have a feeling of sickness in your stomach (nausea) or vomiting.  Your symptoms are not better in 3 days. Return sooner if you are getting worse. SEEK IMMEDIATE MEDICAL CARE IF:   You develop severe vomiting and are unable to keep the medicine down.  You develop severe back or abdominal pain despite taking your medicines.  You begin passing a large amount of blood or clots in your urine.  You feel extremely weak or faint, or you pass out. MAKE SURE YOU:   Understand these instructions.  Will watch your condition.  Will get help right away if you are not doing well or get worse. Document Released: 08/31/2005 Document Revised: 01/15/2014 Document Reviewed: 05/01/2013 [facility] Patient Information 2015 [place], [place]. This information is not intended to replace advice given to you by your health care provider. Make sure you discuss any questions you have with your health care provider.

## 2015-06-13 ENCOUNTER — Encounter: Payer: Self-pay | Admitting: Family Medicine

## 2015-10-24 ENCOUNTER — Encounter: Payer: Medicare Other | Admitting: Family Medicine

## 2015-10-24 NOTE — Progress Notes (Deleted)
   Subjective:    Patient ID: Mackenzie [name], female    DOB: Jul 26, 1946, 70 y.o.   MRN: [id number]  HPI   Review of Systems     Objective:    Physical Exam        Assessment & Plan:

## 2015-11-11 ENCOUNTER — Telehealth: Payer: Self-pay | Admitting: Family Medicine

## 2015-11-11 NOTE — Telephone Encounter (Signed)
Below are the notes I made in preparation for pt's visit which she no-showed:  Ms. Dingee is a 70 yo woman here today for a f/u on her chronic medical conditions. She was last seen for her AWV 4.5 mos prior.  HPL: on fish oil, last checked 5 mos prior with sig iprovement in LDL from 119->100 and t chol 188->174 with HDL 53->57.  Osteoporosis: DEXA 2015 with t-score -2.8, was prev on fosamax from Dr. Chalmers Cater but has now stepped up to reclast, is taking vit D supp.  last vit D level? Perhaps done by Dr. Chalmers Cater?  Hypothyroid - diagnosed with Hashimoto's in 2014 and is now followed by Dr. Chalmers Cater every 6 mos. On armour thyroid 60.  A. Fib - isolated even during her colonoscopy prep with CHADS2 of 0 at that time. Has had cardiology eval who stated no need for anticoag or rate-control since singular provoked episode  Mood d/o with anx and dep:  Greats anx about her health. Has been on lexapro for about 2 yrs which is helping sig. Exercising as well which also helps.  Uses xanax 1-2x/d prn as well. Insomnia: on xanax 0.25mg  with 1/3 tab of ambien qhs for decades per pt.

## 2015-11-11 NOTE — Progress Notes (Signed)
This encounter was created in error - please disregard.

## 2020-12-03 ENCOUNTER — Other Ambulatory Visit: Payer: Self-pay | Admitting: Orthopedic Surgery

## 2020-12-03 DIAGNOSIS — Z1231 Encounter for screening mammogram for malignant neoplasm of breast: Secondary | ICD-10-CM

## 2021-01-27 ENCOUNTER — Other Ambulatory Visit: Payer: Self-pay

## 2021-01-27 ENCOUNTER — Ambulatory Visit
Admission: RE | Admit: 2021-01-27 | Discharge: 2021-01-27 | Disposition: A | Discharge status: Home / Self Care | Payer: Medicare Other | Source: Ambulatory Visit | Attending: Orthopedic Surgery | Admitting: Orthopedic Surgery

## 2021-01-27 DIAGNOSIS — Z1231 Encounter for screening mammogram for malignant neoplasm of breast: Secondary | ICD-10-CM

## 2022-01-21 ENCOUNTER — Other Ambulatory Visit: Payer: Self-pay | Admitting: Orthopedic Surgery

## 2022-01-21 ENCOUNTER — Other Ambulatory Visit: Payer: Self-pay | Admitting: Family Medicine

## 2022-01-21 DIAGNOSIS — Z1231 Encounter for screening mammogram for malignant neoplasm of breast: Secondary | ICD-10-CM

## 2022-02-11 ENCOUNTER — Telehealth: Payer: Self-pay | Admitting: Oncology

## 2022-02-11 NOTE — Telephone Encounter (Signed)
Scheduled appt per 5/30 referral . Pt is aware of appt date and time. Pt is aware to arrive 15 mins prior to appt time and to bring and updated insurance card. Pt is aware of appt location.   

## 2022-02-12 ENCOUNTER — Other Ambulatory Visit: Payer: Self-pay | Admitting: Family Medicine

## 2022-02-12 DIAGNOSIS — M8580 Other specified disorders of bone density and structure, unspecified site: Secondary | ICD-10-CM

## 2022-02-17 ENCOUNTER — Ambulatory Visit
Admission: RE | Admit: 2022-02-17 | Discharge: 2022-02-17 | Disposition: A | Discharge status: Home / Self Care | Payer: Medicare Other | Source: Ambulatory Visit | Attending: Family Medicine | Admitting: Family Medicine

## 2022-02-17 DIAGNOSIS — Z1231 Encounter for screening mammogram for malignant neoplasm of breast: Secondary | ICD-10-CM

## 2022-02-26 ENCOUNTER — Inpatient Hospital Stay: Payer: Medicare Other | Attending: Oncology | Admitting: Oncology

## 2022-02-26 ENCOUNTER — Other Ambulatory Visit: Payer: Self-pay

## 2022-02-26 DIAGNOSIS — D72819 Decreased white blood cell count, unspecified: Secondary | ICD-10-CM | POA: Insufficient documentation

## 2022-02-26 DIAGNOSIS — Z7982 Long term (current) use of aspirin: Secondary | ICD-10-CM | POA: Insufficient documentation

## 2022-02-26 DIAGNOSIS — D709 Neutropenia, unspecified: Secondary | ICD-10-CM

## 2022-02-26 DIAGNOSIS — Z79899 Other long term (current) drug therapy: Secondary | ICD-10-CM | POA: Diagnosis not present

## 2022-02-26 DIAGNOSIS — I4891 Unspecified atrial fibrillation: Secondary | ICD-10-CM | POA: Insufficient documentation

## 2022-02-26 NOTE — Progress Notes (Signed)
Reason for the request:    Leukocytopenia  HPI: I was asked by Dr. Mannie Stabile  to evaluate Mackenzie [name] for the evaluation of fall leukocytopenia.  She is a 76 year old woman with history of atrial fibrillation, thyroiditis and noted to have an low white cell count CBC on Feb 10, 2022.  At that time her white cell count was 2.8 with a hemoglobin of 13 and platelet count of 229.  Absolute neutrophil count was 1.4 with a neutrophil percentage is 50.8.  The differential was otherwise normal.  Previous CBC obtained on June 2015 showed a white cell count of 3.5.  In 2014 she had fluctuating white cell count as low as 3.9 as high as 4.8.  Clinically, she reports feeling well without any complaints.  He denies any recurrent infections or hospitalizations.  She denies any illnesses.  He denies any recurrent cellulitis or excessive fatigue.  She remains active exercises regularly.   She does not report any headaches, blurry vision, syncope or seizures. Does not report any fevers, chills or sweats.  Does not report any cough, wheezing or hemoptysis.  Does not report any chest pain, palpitation, orthopnea or leg edema.  Does not report any nausea, vomiting or abdominal pain.  Does not report any constipation or diarrhea.  Does not report any skeletal complaints.    Does not report frequency, urgency or hematuria.  Does not report any skin rashes or lesions. Does not report any heat or cold intolerance.  Does not report any lymphadenopathy or petechiae.  Does not report any anxiety or depression.  Remaining review of systems is negative.     Past Medical History:  Diagnosis Date   Anxiety    Arthritis    Atrial fibrillation ([place])    Depression    Hashimoto's thyroiditis 10/12/2012   Osteoporosis    per patient questionable   Thyroid disease   :   Past Surgical History:  Procedure Laterality Date   ABDOMINAL HYSTERECTOMY     bladder prolapse surgery   Age 1 or 66.   [employer] in [place].   COLONOSCOPY  N/A 11/23/2012   Procedure: COLONOSCOPY;  Surgeon: Winfield Cunas., MD;  Location: [facility];  Service: Endoscopy;  Laterality: N/A;   COSMETIC SURGERY     JOINT REPLACEMENT     WRIST SURGERY    :   Current Outpatient Medications:    ALPRAZolam (XANAX) 0.25 MG tablet, Take 1 tablet (0.25 mg total) by mouth at bedtime as needed for anxiety or sleep., Disp: 60 tablet, Rfl: 5   aspirin 81 MG chewable tablet, Chew 1 tablet (81 mg total) by mouth daily. (Patient not taking: Reported on 06/06/2015), Disp: , Rfl:    cholecalciferol (VITAMIN D) 1000 UNITS tablet, Take 1,000 Units by mouth daily., Disp: , Rfl:    escitalopram (LEXAPRO) 20 MG tablet, Take 1 tablet (20 mg total) by mouth daily., Disp: 90 tablet, Rfl: 1   fish oil-omega-3 fatty acids 1000 MG capsule, Take 2 capsules by mouth daily., Disp: , Rfl:    HYDROcodone-homatropine (HYCODAN) 5-1.5 MG/5ML syrup, Take 5 mLs by mouth every 6 (six) hours as needed for cough., Disp: 120 mL, Rfl: 0   OVER THE COUNTER MEDICATION, OTC CQ 10 Ubiquinol taking, Disp: , Rfl:    OVER THE COUNTER MEDICATION, Bee Propilis-Royal Jelly, Disp: , Rfl:    polyethylene glycol (MIRALAX / GLYCOLAX) packet, Take 17 g by mouth daily. Hold if develops diarrhea, Disp: 14 each, Rfl: 0  thyroid (ARMOUR) 60 MG tablet, Take 60 mg by mouth daily., Disp: , Rfl:    zolpidem (AMBIEN) 10 MG tablet, Take 0.5 tablets (5 mg total) by mouth at bedtime as needed for sleep. Take 1/2 tablet by mouth at bedtime if needed for sleep, Disp: 30 tablet, Rfl: 5:   Allergies  Allergen Reactions   Clindamycin Hives   Clonazepam Other (See Comments)    Nasal congestion.   Vancomycin Hcl Other (See Comments)    Not sure which medication gave her whelps but this was administered with the clindamycin   :   Family History  Problem Relation Age of Onset   COPD Mother        emphysema   Cancer Father    Alcohol abuse Brother   :   Social History   Socioeconomic History   Marital  status: Married    Spouse name: Not on file   Number of children: Not on file   Years of education: Not on file   Highest education level: Not on file  Occupational History   Not on file  Tobacco Use   Smoking status: Never   Smokeless tobacco: Never  Substance and Sexual Activity   Alcohol use: No   Drug use: No   Sexual activity: Never  Other Topics Concern   Not on file  Social History Narrative   Education: College. Exercise: 4 times a week for 25 minutes. Married.   Social Determinants of Health   Financial Resource Strain: Not on file  Food Insecurity: Not on file  Transportation Needs: Not on file  Physical Activity: Not on file  Stress: Not on file  Social Connections: Not on file  Intimate Partner Violence: Not on file  :  Pertinent items are noted in HPI.  Exam:   General appearance: alert and cooperative appeared without distress. Head: atraumatic without any abnormalities. Eyes: conjunctivae/corneas clear. PERRL.  Sclera anicteric. Throat: lips, mucosa, and tongue normal; without oral thrush or ulcers. Resp: clear to auscultation bilaterally without rhonchi, wheezes or dullness to percussion. Cardio: regular rate and rhythm, S1, S2 normal, no murmur, click, rub or gallop GI: soft, non-tender; bowel sounds normal; no masses,  no organomegaly Skin: Skin color, texture, turgor normal. No rashes or lesions Lymph nodes: Cervical, supraclavicular, and axillary nodes normal. Neurologic: Grossly normal without any motor, sensory or deep tendon reflexes. Musculoskeletal: No joint deformity or effusion.   MM 3D SCREEN BREAST BILATERAL  Result Date: 02/18/2022 CLINICAL DATA:  Screening. EXAM: DIGITAL SCREENING BILATERAL MAMMOGRAM WITH TOMOSYNTHESIS AND CAD TECHNIQUE: Bilateral screening digital craniocaudal and mediolateral oblique mammograms were obtained. Bilateral screening digital breast tomosynthesis was performed. The images were evaluated with computer-aided  detection. COMPARISON:  Previous exam(s). ACR Breast Density Category d: The breast tissue is extremely dense, which lowers the sensitivity of mammography FINDINGS: There are no findings suspicious for malignancy. IMPRESSION: No mammographic evidence of malignancy. A result letter of this screening mammogram will be mailed directly to the patient. RECOMMENDATION: Screening mammogram in one year. (Code:SM-B-01Y) BI-RADS CATEGORY  1: Negative. Electronically Signed   By: Abelardo Diesel M.D.   On: 02/18/2022 11:39    Assessment and Plan:   76 year old with:  1.  Leukocytopenia detected on a CBC on Feb 10, 2022 with a white cell count of 2.8 and a normal differential and normal hemoglobin.  Previous white cell count did show fluctuating white cell count dating back to 2014  The natural course of these findings and the differential  diagnosis was discussed.  Fluctuating leukocytopenia without any true hematological disorder is the most likely etiology.  Autoimmune etiologies also be a consideration.  Medication, infection and other causes could be influencing her white cell count.  A primary hematological disorder such as leukemia, lymphoma or any other lymphoproliferative disorders are considered unlikely.  From a management standpoint, I recommended observation and surveillance with repeat testing in the next 3 to 4 months.  Further evaluation with a bone marrow biopsy is deferred at this time and is unlikely to yield any abnormalities.  2.  Follow-up: Will be in 3 months for repeat evaluation.  She will have repeat CBC and final recommendations.  45  minutes were dedicated to this visit. The time was spent on reviewing laboratory data, discussing treatment options, discussing differential diagnosis and answering questions regarding future plan.     A copy of this consult has been forwarded to the requesting physician.

## 2022-03-31 ENCOUNTER — Encounter: Payer: Self-pay | Admitting: *Deleted

## 2022-04-01 ENCOUNTER — Ambulatory Visit: Payer: Medicare Other | Admitting: Diagnostic Neuroimaging

## 2022-05-11 ENCOUNTER — Encounter: Payer: Self-pay | Admitting: Diagnostic Neuroimaging

## 2022-05-11 ENCOUNTER — Ambulatory Visit (INDEPENDENT_AMBULATORY_CARE_PROVIDER_SITE_OTHER): Payer: Medicare Other | Admitting: Diagnostic Neuroimaging

## 2022-05-11 VITALS — BP 105/62 | HR 81 | Ht 65.0 in | Wt 118.0 lb

## 2022-05-11 DIAGNOSIS — R799 Abnormal finding of blood chemistry, unspecified: Secondary | ICD-10-CM

## 2022-05-11 DIAGNOSIS — R200 Anesthesia of skin: Secondary | ICD-10-CM

## 2022-05-11 DIAGNOSIS — M79671 Pain in right foot: Secondary | ICD-10-CM

## 2022-05-11 DIAGNOSIS — M79642 Pain in left hand: Secondary | ICD-10-CM

## 2022-05-11 DIAGNOSIS — M79641 Pain in right hand: Secondary | ICD-10-CM

## 2022-05-11 DIAGNOSIS — M79672 Pain in left foot: Secondary | ICD-10-CM

## 2022-05-11 NOTE — Progress Notes (Signed)
[facility]  PATIENT: Mackenzie [name] DOB: [birth year]  REFERRING CLINICIAN: Caren Macadam, MD HISTORY FROM: PATIENT REASON FOR VISIT: NEW CONSULT   HISTORICAL  CHIEF COMPLAINT:  Chief Complaint  Patient presents with   Worsening Neuropathy    Rm 10 New pt "feet, legs, hands; started pravastatin Nov 2022 and within weeks pain going up my legs, then pain in hands so I stopped med, no improvement; tried gabapentin but it made me foggy and I don't want to be on medication all my life"     HISTORY OF PRESENT ILLNESS:   76 year old female here for evaluation of abnormal sensation in feet.  Symptoms started 2 to 3 years ago.  Initially started in the left foot and then spread to the right foot.  Also has cold sensations in hands and feet.  She started pravastatin in November 2022 which made symptoms worse and therefore she stopped the medication.   REVIEW OF SYSTEMS: Full 14 system review of systems performed and negative with exception of: as per HPI.  ALLERGIES: Allergies  Allergen Reactions   Clindamycin Hives   Clonazepam Other (See Comments)    Nasal congestion.   Vancomycin Hcl Other (See Comments)    Not sure which medication gave her whelps but this was administered with the clindamycin     HOME MEDICATIONS: Outpatient Medications Prior to Visit  Medication Sig Dispense Refill   ALPRAZolam (XANAX) 0.25 MG tablet Take 1 tablet (0.25 mg total) by mouth at bedtime as needed for anxiety or sleep. 60 tablet 5   Ascorbic Acid (VITAMIN C) 500 MG CAPS See admin instructions.     aspirin 81 MG chewable tablet Chew 1 tablet (81 mg total) by mouth daily.     cholecalciferol (VITAMIN D) 1000 UNITS tablet Take 1,000 Units by mouth daily.     folic acid (FOLATE) 505 MCG tablet Take 400 mcg by mouth daily.     Methylcobalamin (B12-ACTIVE PO) Take 1 mg by mouth 3 (three) times daily.     OVER THE COUNTER MEDICATION Bee Propilis-Royal Jelly     polyethylene glycol  (MIRALAX / GLYCOLAX) packet Take 17 g by mouth daily. Hold if develops diarrhea 14 each 0   thyroid (ARMOUR) 60 MG tablet Take 60 mg by mouth daily.     Turmeric (QC TUMERIC COMPLEX PO) Take 10 mg by mouth daily.     UNABLE TO FIND Med Name: collagen peptide powder     zolpidem (AMBIEN) 10 MG tablet Take 0.5 tablets (5 mg total) by mouth at bedtime as needed for sleep. Take 1/2 tablet by mouth at bedtime if needed for sleep 30 tablet 5   fish oil-omega-3 fatty acids 1000 MG capsule Take 2 capsules by mouth daily.     OVER THE COUNTER MEDICATION OTC CQ 10 Ubiquinol taking     escitalopram (LEXAPRO) 20 MG tablet Take 1 tablet (20 mg total) by mouth daily. 90 tablet 1   HYDROcodone-homatropine (HYCODAN) 5-1.5 MG/5ML syrup Take 5 mLs by mouth every 6 (six) hours as needed for cough. 120 mL 0   No facility-administered medications prior to visit.    PAST MEDICAL HISTORY: Past Medical History:  Diagnosis Date   Anxiety    Arthritis    Atrial fibrillation ([place])    Burning mouth syndrome    Depression    Hashimoto's thyroiditis 10/12/2012   Hypothyroid    Insomnia    Osteoporosis    per patient questionable   Paresthesias  arms/legs   Thyroid disease     PAST SURGICAL HISTORY: Past Surgical History:  Procedure Laterality Date   ABDOMINAL HYSTERECTOMY  2008   bladder prolapse surgery   Age 21 or 3.   [employer] in [place].   BLADDER SUSPENSION  2008   COLONOSCOPY N/A 11/23/2012   Procedure: COLONOSCOPY;  Surgeon: Winfield Cunas., MD;  Location: [facility];  Service: Endoscopy;  Laterality: N/A;   COSMETIC SURGERY     JOINT REPLACEMENT     WRIST SURGERY      FAMILY HISTORY: Family History  Problem Relation Age of Onset   COPD Mother        emphysema   Cancer Father    Prostate cancer Father    Alcohol abuse Brother     SOCIAL HISTORY: Social History   Socioeconomic History   Marital status: Married    Spouse name: Tressie Ellis   Number of children: 2    Years of education: Not on file   Highest education level: Some college, no degree  Occupational History   Not on file  Tobacco Use   Smoking status: Never   Smokeless tobacco: Never  Substance and Sexual Activity   Alcohol use: No   Drug use: No   Sexual activity: Never  Other Topics Concern   Not on file  Social History Narrative   Education: Secretary/administrator. Exercise: 4 times a week for 25 minutes. Married.   Little caffeine   Social Determinants of Radio broadcast assistant Strain: Not on file  Food Insecurity: Not on file  Transportation Needs: Not on file  Physical Activity: Not on file  Stress: Not on file  Social Connections: Not on file  Intimate Partner Violence: Not on file     PHYSICAL EXAM  GENERAL EXAM/CONSTITUTIONAL: Vitals:  Vitals:   05/11/22 1014  BP: 105/62  Pulse: 81  Weight: 118 lb (53.5 kg)  Height: '5\' 5"'$  (1.651 m)   Body mass index is 19.64 kg/m. Wt Readings from Last 3 Encounters:  05/11/22 118 lb (53.5 kg)  06/06/15 117 lb 12.8 oz (53.4 kg)  04/19/15 119 lb (54 kg)   Patient is in no distress; well developed, nourished and groomed; neck is supple  CARDIOVASCULAR: Examination of carotid arteries is normal; no carotid bruits Regular rate and rhythm, no murmurs Examination of peripheral vascular system by observation and palpation is normal  EYES: Ophthalmoscopic exam of optic discs and posterior segments is normal; no papilledema or hemorrhages No results found.  MUSCULOSKELETAL: Gait, strength, tone, movements noted in Neurologic exam below  NEUROLOGIC: MENTAL STATUS:      No data to display         awake, alert, oriented to person, place and time recent and remote memory intact normal attention and concentration language fluent, comprehension intact, naming intact fund of knowledge appropriate  CRANIAL NERVE:  2nd - no papilledema on fundoscopic exam 2nd, 3rd, 4th, 6th - pupils equal and reactive to light, visual fields  full to confrontation, extraocular muscles intact, no nystagmus 5th - facial sensation symmetric 7th - facial strength symmetric 8th - hearing intact 9th - palate elevates symmetrically, uvula midline 11th - shoulder shrug symmetric 12th - tongue protrusion midline  MOTOR:  normal bulk and tone, full strength in the BUE, BLE  SENSORY:  normal and symmetric to light touch, pinprick, temperature, vibration  COORDINATION:  finger-nose-finger, fine finger movements normal  REFLEXES:  deep tendon reflexes TRACE and symmetric  GAIT/STATION:  narrow  based gait     DIAGNOSTIC DATA (LABS, IMAGING, TESTING) - I reviewed patient records, labs, notes, testing and imaging myself where available.  Lab Results  Component Value Date   WBC 3.5 (L) 02/14/2014   HGB 13.9 02/14/2014   HCT 41.7 02/14/2014   MCV 90.8 02/14/2014   PLT 229 02/14/2014      Component Value Date/Time   NA 139 06/06/2015 1004   K 4.2 06/06/2015 1004   CL 102 06/06/2015 1004   CO2 27 06/06/2015 1004   GLUCOSE 89 06/06/2015 1004   BUN 13 06/06/2015 1004   CREATININE 0.74 06/06/2015 1004   CALCIUM 9.2 06/06/2015 1004   PROT 6.6 06/06/2015 1004   ALBUMIN 4.1 06/06/2015 1004   AST 17 06/06/2015 1004   ALT 6 06/06/2015 1004   ALKPHOS 50 06/06/2015 1004   BILITOT 0.7 06/06/2015 1004   GFRNONAA >90 11/24/2012 0422   GFRAA >90 11/24/2012 0422   Lab Results  Component Value Date   CHOL 174 06/06/2015   HDL 57 06/06/2015   LDLCALC 100 06/06/2015   TRIG 83 06/06/2015   CHOLHDL 3.1 06/06/2015   No results found for: "HGBA1C" No results found for: "VITAMINB12" Lab Results  Component Value Date   TSH 0.376 11/22/2012       ASSESSMENT AND PLAN  76 y.o. year old female here with:   Dx:  1. Numbness   2. Pain in both feet   3. Pain in both hands   4. Abnormal finding of blood chemistry, unspecified       PLAN:  NUMBNESS PAIN IN HANDS AND FEET (since ~2020) - check neuropathy  labs  Orders Placed This Encounter  Procedures   A1c   SPEP with IFE   ANA w/Reflex   SSA, SSB   Vitamin B6   Vitamin B1   Uric Acid   Iron, TIBC and Ferritin Panel   CBC with Differential/Platelet   Return for pending if symptoms worsen or fail to improve, pending test results.    Penni Bombard, MD 9/32/3557, 32:20 AM Certified in Neurology, Neurophysiology and Neuroimaging  [facility] [address] [place], [place] [address] [phone]

## 2022-05-15 LAB — SJOGREN'S SYNDROME ANTIBODS(SSA + SSB)
ENA SSA (RO) Ab: <0.2 AI (ref 0.0–0.9)
ENA SSB (LA) Ab: 1.9 AI — ABNORMAL HIGH (ref 0.0–0.9)

## 2022-05-15 LAB — ANA W/REFLEX: ANA Titer 1: NEGATIVE

## 2022-05-15 LAB — IRON,TIBC AND FERRITIN PANEL
Ferritin: 66 ng/mL (ref 15–150)
Iron Saturation: 37 % (ref 15–55)
Iron: 133 ug/dL (ref 27–139)
Total Iron Binding Capacity: 355 ug/dL (ref 250–450)
UIBC: 222 ug/dL (ref 118–369)

## 2022-05-15 LAB — CBC WITH DIFFERENTIAL/PLATELET
Basophils Absolute: 0 10*3/uL (ref 0.0–0.2)
Basos: 1 %
EOS (ABSOLUTE): 0 10*3/uL (ref 0.0–0.4)
Eos: 1 %
Hematocrit: 43 % (ref 34.0–46.6)
Hemoglobin: 14.3 g/dL (ref 11.1–15.9)
Immature Grans (Abs): 0 10*3/uL (ref 0.0–0.1)
Immature Granulocytes: 0 %
Lymphocytes Absolute: 1.1 10*3/uL (ref 0.7–3.1)
Lymphs: 26 %
MCH: 31 pg (ref 26.6–33.0)
MCHC: 33.3 g/dL (ref 31.5–35.7)
MCV: 93 fL (ref 79–97)
Monocytes Absolute: 0.4 10*3/uL (ref 0.1–0.9)
Monocytes: 9 %
Neutrophils Absolute: 2.6 10*3/uL (ref 1.4–7.0)
Neutrophils: 63 %
Platelets: 225 10*3/uL (ref 150–450)
RBC: 4.61 x10E6/uL (ref 3.77–5.28)
RDW: 13 % (ref 11.7–15.4)
WBC: 4.1 10*3/uL (ref 3.4–10.8)

## 2022-05-15 LAB — MULTIPLE MYELOMA PANEL, SERUM
Albumin SerPl Elph-Mcnc: 3.8 g/dL (ref 2.9–4.4)
Albumin/Glob SerPl: 1.4 (ref 0.7–1.7)
Alpha 1: 0.3 g/dL (ref 0.0–0.4)
Alpha2 Glob SerPl Elph-Mcnc: 0.7 g/dL (ref 0.4–1.0)
B-Globulin SerPl Elph-Mcnc: 1 g/dL (ref 0.7–1.3)
Gamma Glob SerPl Elph-Mcnc: 0.9 g/dL (ref 0.4–1.8)
Globulin, Total: 2.9 g/dL (ref 2.2–3.9)
IgA/Immunoglobulin A, Serum: 196 mg/dL (ref 64–422)
IgG (Immunoglobin G), Serum: 1007 mg/dL (ref 586–1602)
IgM (Immunoglobulin M), Srm: 44 mg/dL (ref 26–217)
Total Protein: 6.7 g/dL (ref 6.0–8.5)

## 2022-05-15 LAB — HEMOGLOBIN A1C
Est. average glucose Bld gHb Est-mCnc: 117 mg/dL
Hgb A1c MFr Bld: 5.7 % — ABNORMAL HIGH (ref 4.8–5.6)

## 2022-05-15 LAB — URIC ACID: Uric Acid: 2.9 mg/dL — ABNORMAL LOW (ref 3.1–7.9)

## 2022-05-15 LAB — VITAMIN B1: Thiamine: 120 nmol/L (ref 66.5–200.0)

## 2022-05-15 LAB — VITAMIN B6: Vitamin B6: 11.3 ug/L (ref 3.4–65.2)

## 2022-05-20 ENCOUNTER — Telehealth: Payer: Self-pay | Admitting: Diagnostic Neuroimaging

## 2022-05-20 NOTE — Telephone Encounter (Signed)
Pt called wanting to know when she will be called regarding her lab work results that were done on 8/28.

## 2022-05-21 NOTE — Telephone Encounter (Signed)
Called patient, informed her Labs notable for slightly elevated SSB antibody, sometimes seen in association with autoimmune conditions but not clear-cut.  Other labs are unremarkable, except for minor outliers.  Consider follow-up with PCP or rheumatology (for SSB).  She would like to get copy of labs, I advised she sign release or get my chart. She asked for release to be e mailed to her.  [email or web address] she will sign and mail back. Patient verbalized understanding, appreciation. Release e mailed to patient.

## 2022-05-28 NOTE — Telephone Encounter (Signed)
Received signed release of information from patient to get her lab results. Results mailed to patient, release sent to medical records for scanning.

## 2022-05-29 ENCOUNTER — Inpatient Hospital Stay (HOSPITAL_BASED_OUTPATIENT_CLINIC_OR_DEPARTMENT_OTHER): Payer: Medicare Other | Admitting: Oncology

## 2022-05-29 ENCOUNTER — Inpatient Hospital Stay: Payer: Medicare Other | Attending: Oncology

## 2022-05-29 ENCOUNTER — Other Ambulatory Visit: Payer: Self-pay

## 2022-05-29 VITALS — BP 118/52 | HR 86 | Temp 98.0°F | Resp 16 | Ht 65.0 in | Wt 118.7 lb

## 2022-05-29 DIAGNOSIS — D72819 Decreased white blood cell count, unspecified: Secondary | ICD-10-CM | POA: Insufficient documentation

## 2022-05-29 DIAGNOSIS — D709 Neutropenia, unspecified: Secondary | ICD-10-CM

## 2022-05-29 DIAGNOSIS — Z79899 Other long term (current) drug therapy: Secondary | ICD-10-CM | POA: Diagnosis not present

## 2022-05-29 LAB — CBC WITH DIFFERENTIAL (CANCER CENTER ONLY)
Abs Immature Granulocytes: 0 10*3/uL (ref 0.00–0.07)
Basophils Absolute: 0 10*3/uL (ref 0.0–0.1)
Basophils Relative: 1 %
Eosinophils Absolute: 0 10*3/uL (ref 0.0–0.5)
Eosinophils Relative: 1 %
HCT: 41.6 % (ref 36.0–46.0)
Hemoglobin: 13.9 g/dL (ref 12.0–15.0)
Immature Granulocytes: 0 %
Lymphocytes Relative: 29 %
Lymphs Abs: 1.1 10*3/uL (ref 0.7–4.0)
MCH: 31.2 pg (ref 26.0–34.0)
MCHC: 33.4 g/dL (ref 30.0–36.0)
MCV: 93.3 fL (ref 80.0–100.0)
Monocytes Absolute: 0.4 10*3/uL (ref 0.1–1.0)
Monocytes Relative: 10 %
Neutro Abs: 2.2 10*3/uL (ref 1.7–7.7)
Neutrophils Relative %: 59 %
Platelet Count: 201 10*3/uL (ref 150–400)
RBC: 4.46 MIL/uL (ref 3.87–5.11)
RDW: 13.2 % (ref 11.5–15.5)
WBC Count: 3.7 10*3/uL — ABNORMAL LOW (ref 4.0–10.5)
nRBC: 0 % (ref 0.0–0.2)

## 2022-05-29 NOTE — Progress Notes (Signed)
Hematology and Oncology Follow Up Visit  Mackenzie [name] [id number] 1946-05-07 76 y.o. 05/29/2022 8:02 AM Mackenzie [name], MDHagler, Apolonio Schneiders, MD   Principle Diagnosis: 76 year old woman with leukocytopenia noted in May 2023.  She had fluctuating counts previously in 2014 with white cell count close to 2.8 and normal CBC otherwise.      Current therapy: Active surveillance.  Interim History: [name] returns for a follow-up.  Since last visit, she reports feeling well without any major complaints.  She denies any nausea, vomiting or abdominal pain.  She denies any hospitalizations or illnesses.  She denies excessive fatigue or tiredness.  She denies constitutional symptoms or recurrent infections.  She denies any skin rash or petechiae.    Medications: I have reviewed the patient's current medications.  Current Outpatient Medications  Medication Sig Dispense Refill   ALPRAZolam (XANAX) 0.25 MG tablet Take 1 tablet (0.25 mg total) by mouth at bedtime as needed for anxiety or sleep. 60 tablet 5   Ascorbic Acid (VITAMIN C) 500 MG CAPS See admin instructions.     aspirin 81 MG chewable tablet Chew 1 tablet (81 mg total) by mouth daily.     cholecalciferol (VITAMIN D) 1000 UNITS tablet Take 1,000 Units by mouth daily.     folic acid (FOLATE) 485 MCG tablet Take 400 mcg by mouth daily.     Methylcobalamin (B12-ACTIVE PO) Take 1 mg by mouth 3 (three) times daily.     OVER THE COUNTER MEDICATION Bee Propilis-Royal Jelly     polyethylene glycol (MIRALAX / GLYCOLAX) packet Take 17 g by mouth daily. Hold if develops diarrhea 14 each 0   thyroid (ARMOUR) 60 MG tablet Take 60 mg by mouth daily.     Turmeric (QC TUMERIC COMPLEX PO) Take 10 mg by mouth daily.     UNABLE TO FIND Med Name: collagen peptide powder     zolpidem (AMBIEN) 10 MG tablet Take 0.5 tablets (5 mg total) by mouth at bedtime as needed for sleep. Take 1/2 tablet by mouth at bedtime if needed for sleep 30 tablet 5   No current  facility-administered medications for this visit.     Allergies:  Allergies  Allergen Reactions   Clindamycin Hives   Clonazepam Other (See Comments)    Nasal congestion.   Vancomycin Hcl Other (See Comments)    Not sure which medication gave her whelps but this was administered with the clindamycin       Physical Exam: Blood pressure (!) 118/52, pulse 86, temperature 98 F (36.7 C), temperature source Temporal, resp. rate 16, height '5\' 5"'$  (1.651 m), weight 118 lb 11.2 oz (53.8 kg), SpO2 99 %.  ECOG: 0    General appearance: Comfortable appearing without any discomfort Head: Normocephalic without any trauma Oropharynx: Mucous membranes are moist and pink without any thrush or ulcers. Eyes: Pupils are equal and round reactive to light. Lymph nodes: No cervical, supraclavicular, inguinal or axillary lymphadenopathy.   Heart:regular rate and rhythm.  S1 and S2 without leg edema. Lung: Clear without any rhonchi or wheezes.  No dullness to percussion. Abdomin: Soft, nontender, nondistended with good bowel sounds.  No hepatosplenomegaly. Musculoskeletal: No joint deformity or effusion.  Full range of motion noted. Neurological: No deficits noted on motor, sensory and deep tendon reflex exam. Skin: No petechial rash or dryness.  Appeared moist.     Lab Results: Lab Results  Component Value Date   WBC 4.1 05/11/2022   HGB 14.3 05/11/2022   HCT 43.0 05/11/2022  MCV 93 05/11/2022   PLT 225 05/11/2022     Chemistry      Component Value Date/Time   NA 139 06/06/2015 1004   K 4.2 06/06/2015 1004   CL 102 06/06/2015 1004   CO2 27 06/06/2015 1004   BUN 13 06/06/2015 1004   CREATININE 0.74 06/06/2015 1004      Component Value Date/Time   CALCIUM 9.2 06/06/2015 1004   ALKPHOS 50 06/06/2015 1004   AST 17 06/06/2015 1004   ALT 6 06/06/2015 1004   BILITOT 0.7 06/06/2015 1004         Impression and Plan:   76 year old with:  1.  Mild leukocytopenia with normal  differential and a normal CBC detected in May 2023.  She was found to have a white cell count of 2.8.   Repeat laboratory testing on May 11, 2022 showed a normal white cell count of 4.1 and a normal differential.  Differential diagnosis of these findings were reviewed at this time.  Fluctuating leukocytopenia for benign causes as the likely etiology.  I do not see any evidence to suggest hematological disorder including myelodysplasia or any infiltrative bone marrow process.  I see no need for further hematology evaluation at this time.   2.  Follow-up: Happy to see her in the future as needed.   20 minutes were spent on this encounter.  The time was dedicated to reviewing laboratory data, disease status update and outlining future plan of care discussion.       Zola Button, MD 9/15/20238:02 AM

## 2022-06-17 ENCOUNTER — Telehealth: Payer: Self-pay | Admitting: *Deleted

## 2022-06-17 NOTE — Chronic Care Management (AMB) (Signed)
  Care Coordination   Note   06/17/2022 Name: Mackenzie [name] MRN: [id number] DOB: Sep 10, 1946  Mackenzie [name] is a 76 y.o. year old female who sees Caren Macadam, MD for primary care. I reached out to Darryl Nestle by phone today to offer care coordination services.  [name] was given information about Care Coordination services today including:   The Care Coordination services include support from the care team which includes your Nurse Coordinator, Clinical Social Worker, or Pharmacist.  The Care Coordination team is here to help remove barriers to the health concerns and goals most important to you. Care Coordination services are voluntary, and the patient may decline or stop services at any time by request to their care team member.   Care Coordination Consent Status: Patient did not agree to participate in care coordination services at this time.    Encounter Outcome:  Pt. Refused  [place]  Direct Dial: [phone]

## 2022-07-31 ENCOUNTER — Other Ambulatory Visit: Payer: Medicare Other

## 2024-05-30 ENCOUNTER — Other Ambulatory Visit: Payer: Self-pay | Admitting: Family Medicine

## 2024-05-30 DIAGNOSIS — N6452 Nipple discharge: Secondary | ICD-10-CM

## 2024-06-05 ENCOUNTER — Ambulatory Visit
Admission: RE | Admit: 2024-06-05 | Discharge: 2024-06-05 | Disposition: A | Discharge status: Home / Self Care | Source: Ambulatory Visit | Attending: Family Medicine | Admitting: Family Medicine

## 2024-06-05 ENCOUNTER — Other Ambulatory Visit: Payer: Self-pay | Admitting: Family Medicine

## 2024-06-05 DIAGNOSIS — N6452 Nipple discharge: Secondary | ICD-10-CM

## 2024-06-07 ENCOUNTER — Encounter

## 2024-06-07 ENCOUNTER — Other Ambulatory Visit

## 2024-06-22 ENCOUNTER — Other Ambulatory Visit: Payer: Self-pay | Admitting: Family Medicine

## 2024-06-22 DIAGNOSIS — N649 Disorder of breast, unspecified: Secondary | ICD-10-CM

## 2024-06-22 DIAGNOSIS — N6459 Other signs and symptoms in breast: Secondary | ICD-10-CM

## 2024-07-25 ENCOUNTER — Other Ambulatory Visit
